# Patient Record
Sex: Female | Born: 1950 | Race: White | Hispanic: No | Smoking: Current every day smoker
Health system: Southern US, Community
[De-identification: ages and names within clinical notes are randomized; demographics above are authoritative.]

## PROBLEM LIST (undated history)

## (undated) DIAGNOSIS — M2141 Flat foot [pes planus] (acquired), right foot: Secondary | ICD-10-CM

## (undated) DIAGNOSIS — Z780 Asymptomatic menopausal state: Secondary | ICD-10-CM

## (undated) DIAGNOSIS — G4700 Insomnia, unspecified: Secondary | ICD-10-CM

## (undated) DIAGNOSIS — R413 Other amnesia: Secondary | ICD-10-CM

## (undated) DIAGNOSIS — F32A Depression, unspecified: Secondary | ICD-10-CM

## (undated) DIAGNOSIS — A6000 Herpesviral infection of urogenital system, unspecified: Secondary | ICD-10-CM

## (undated) DIAGNOSIS — K219 Gastro-esophageal reflux disease without esophagitis: Secondary | ICD-10-CM

## (undated) DIAGNOSIS — T7840XA Allergy, unspecified, initial encounter: Secondary | ICD-10-CM

## (undated) DIAGNOSIS — R946 Abnormal results of thyroid function studies: Secondary | ICD-10-CM

## (undated) DIAGNOSIS — M722 Plantar fascial fibromatosis: Secondary | ICD-10-CM

## (undated) DIAGNOSIS — G43909 Migraine, unspecified, not intractable, without status migrainosus: Secondary | ICD-10-CM

## (undated) DIAGNOSIS — E039 Hypothyroidism, unspecified: Secondary | ICD-10-CM

## (undated) DIAGNOSIS — G4733 Obstructive sleep apnea (adult) (pediatric): Secondary | ICD-10-CM

## (undated) DIAGNOSIS — K5790 Diverticulosis of intestine, part unspecified, without perforation or abscess without bleeding: Secondary | ICD-10-CM

## (undated) DIAGNOSIS — F988 Other specified behavioral and emotional disorders with onset usually occurring in childhood and adolescence: Secondary | ICD-10-CM

## (undated) DIAGNOSIS — M8580 Other specified disorders of bone density and structure, unspecified site: Secondary | ICD-10-CM

## (undated) DIAGNOSIS — E559 Vitamin D deficiency, unspecified: Secondary | ICD-10-CM

## (undated) DIAGNOSIS — M2142 Flat foot [pes planus] (acquired), left foot: Secondary | ICD-10-CM

## (undated) DIAGNOSIS — Z8742 Personal history of other diseases of the female genital tract: Secondary | ICD-10-CM

## (undated) DIAGNOSIS — F419 Anxiety disorder, unspecified: Secondary | ICD-10-CM

## (undated) DIAGNOSIS — R931 Abnormal findings on diagnostic imaging of heart and coronary circulation: Secondary | ICD-10-CM

## (undated) DIAGNOSIS — N859 Noninflammatory disorder of uterus, unspecified: Secondary | ICD-10-CM

## (undated) DIAGNOSIS — L52 Erythema nodosum: Secondary | ICD-10-CM

## (undated) DIAGNOSIS — G479 Sleep disorder, unspecified: Secondary | ICD-10-CM

## (undated) DIAGNOSIS — Z803 Family history of malignant neoplasm of breast: Secondary | ICD-10-CM

## (undated) DIAGNOSIS — Z7289 Other problems related to lifestyle: Secondary | ICD-10-CM

## (undated) HISTORY — DX: Hypothyroidism, unspecified: E03.9

## (undated) HISTORY — DX: Herpesviral infection of urogenital system, unspecified: A60.00

## (undated) HISTORY — PX: TONSILLECTOMY: SHX5217

## (undated) HISTORY — DX: Flat foot (pes planus) (acquired), right foot: M21.41

## (undated) HISTORY — PX: ENDOMETRIAL ABLATION: SHX621

## (undated) HISTORY — DX: Abnormal results of thyroid function studies: R94.6

## (undated) HISTORY — DX: Anxiety disorder, unspecified: F41.9

## (undated) HISTORY — DX: Erythema nodosum: L52

## (undated) HISTORY — DX: Family history of malignant neoplasm of breast: Z80.3

## (undated) HISTORY — DX: Noninflammatory disorder of uterus, unspecified: N85.9

## (undated) HISTORY — DX: Obstructive sleep apnea (adult) (pediatric): G47.33

## (undated) HISTORY — DX: Allergy, unspecified, initial encounter: T78.40XA

## (undated) HISTORY — DX: Other specified disorders of bone density and structure, unspecified site: M85.80

## (undated) HISTORY — PX: DILATION AND CURETTAGE OF UTERUS: SHX78

## (undated) HISTORY — DX: Other problems related to lifestyle: Z72.89

## (undated) HISTORY — DX: Insomnia, unspecified: G47.00

## (undated) HISTORY — DX: Diverticulosis of intestine, part unspecified, without perforation or abscess without bleeding: K57.90

## (undated) HISTORY — DX: Gastro-esophageal reflux disease without esophagitis: K21.9

## (undated) HISTORY — DX: Sleep disorder, unspecified: G47.9

## (undated) HISTORY — DX: Depression, unspecified: F32.A

## (undated) HISTORY — DX: Personal history of other diseases of the female genital tract: Z87.42

## (undated) HISTORY — DX: Flat foot (pes planus) (acquired), right foot: M21.42

## (undated) HISTORY — DX: Abnormal findings on diagnostic imaging of heart and coronary circulation: R93.1

## (undated) HISTORY — DX: Vitamin D deficiency, unspecified: E55.9

## (undated) HISTORY — DX: Asymptomatic menopausal state: Z78.0

## (undated) HISTORY — DX: Other amnesia: R41.3

## (undated) HISTORY — DX: Plantar fascial fibromatosis: M72.2

## (undated) HISTORY — DX: Migraine, unspecified, not intractable, without status migrainosus: G43.909

## (undated) HISTORY — PX: OTHER SURGICAL HISTORY: SHX169

## (undated) HISTORY — DX: Other specified behavioral and emotional disorders with onset usually occurring in childhood and adolescence: F98.8

---

## 2010-06-27 ENCOUNTER — Encounter: Payer: Self-pay | Admitting: Internal Medicine

## 2010-10-12 ENCOUNTER — Telehealth: Payer: Self-pay | Admitting: Internal Medicine

## 2010-11-01 ENCOUNTER — Ambulatory Visit: Admit: 2010-11-01 | Payer: Self-pay | Admitting: Internal Medicine

## 2010-11-29 NOTE — Progress Notes (Signed)
Summary: REQUEST TO EST AS NEW PT W/ DR SWORDS  Phone Note Call from Patient   Caller: Patient   [phone] Summary of Call: ---- 10/07/2010 1:48 PM, Birdie Sons MD wrote: ok  ---- 10/04/2010 4:47 PM, Debbra Riding wrote: Dr Cato Mulligan,  Caller:  Jerene Pitch,  DOB: [birth year]  Pt currently doesn't have insurance... Will be self pay.  Daughter and Son-n-law Irving Burton and Ralene Muskrat) - with whom she is currently residing with -  are current pts of Dr Cato Mulligan.  Caller just moved to [facility] from [place], will need cpx...Marland KitchenMarland KitchenWould like to become established with Dr Cato Mulligan.... Will you accept as a new patient?  Call back #  [phone]  or  [phone]   Initial call taken by: Debbra Riding,  October 12, 2010 12:05 PM     Appended Document: REQUEST TO EST AS NEW PT W/ DR SWORDS Pt was contacted and appt was scheduled for new pt est with Dr Cato Mulligan on  Jan. 5, 2012  at  8am.... Pt will be self pay - understands cost.

## 2010-11-29 NOTE — Letter (Signed)
Summary: Records from [facility] 2010 - 2011  Records from [facility] 2010 - 2011   Imported By: Maryln Gottron 11/05/2010 10:06:43  _____________________________________________________________________  External Attachment:    Type:   Image     Comment:   External Document

## 2012-03-06 ENCOUNTER — Other Ambulatory Visit (HOSPITAL_COMMUNITY): Payer: Self-pay | Admitting: Family Medicine

## 2012-03-06 DIAGNOSIS — Z1231 Encounter for screening mammogram for malignant neoplasm of breast: Secondary | ICD-10-CM

## 2012-03-24 ENCOUNTER — Other Ambulatory Visit (HOSPITAL_COMMUNITY)
Admission: RE | Admit: 2012-03-24 | Discharge: 2012-03-24 | Disposition: A | Discharge status: Home / Self Care | Payer: 59 | Source: Ambulatory Visit | Attending: Family Medicine | Admitting: Family Medicine

## 2012-03-24 DIAGNOSIS — Z124 Encounter for screening for malignant neoplasm of cervix: Secondary | ICD-10-CM | POA: Insufficient documentation

## 2012-03-24 DIAGNOSIS — Z1159 Encounter for screening for other viral diseases: Secondary | ICD-10-CM | POA: Insufficient documentation

## 2012-03-31 ENCOUNTER — Ambulatory Visit (HOSPITAL_COMMUNITY)
Admission: RE | Admit: 2012-03-31 | Discharge: 2012-03-31 | Disposition: A | Discharge status: Home / Self Care | Payer: 59 | Source: Ambulatory Visit | Attending: Family Medicine | Admitting: Family Medicine

## 2012-03-31 DIAGNOSIS — Z1231 Encounter for screening mammogram for malignant neoplasm of breast: Secondary | ICD-10-CM | POA: Insufficient documentation

## 2012-11-05 ENCOUNTER — Other Ambulatory Visit: Payer: Self-pay | Admitting: Otolaryngology

## 2012-11-05 DIAGNOSIS — R0982 Postnasal drip: Secondary | ICD-10-CM

## 2012-11-09 ENCOUNTER — Other Ambulatory Visit: Payer: 59

## 2012-11-12 ENCOUNTER — Ambulatory Visit
Admission: RE | Admit: 2012-11-12 | Discharge: 2012-11-12 | Disposition: A | Discharge status: Home / Self Care | Payer: 59 | Source: Ambulatory Visit | Attending: Otolaryngology | Admitting: Otolaryngology

## 2012-11-12 DIAGNOSIS — R0982 Postnasal drip: Secondary | ICD-10-CM

## 2018-08-04 DIAGNOSIS — B001 Herpesviral vesicular dermatitis: Secondary | ICD-10-CM | POA: Diagnosis not present

## 2018-08-11 DIAGNOSIS — B029 Zoster without complications: Secondary | ICD-10-CM | POA: Diagnosis not present

## 2018-10-09 DIAGNOSIS — B029 Zoster without complications: Secondary | ICD-10-CM | POA: Diagnosis not present

## 2019-03-24 ENCOUNTER — Other Ambulatory Visit: Payer: Self-pay | Admitting: Family Medicine

## 2019-03-24 ENCOUNTER — Other Ambulatory Visit (HOSPITAL_COMMUNITY)
Admission: RE | Admit: 2019-03-24 | Discharge: 2019-03-24 | Disposition: A | Discharge status: Home / Self Care | Payer: Medicare Other | Source: Ambulatory Visit | Attending: Family Medicine | Admitting: Family Medicine

## 2019-03-24 DIAGNOSIS — Z124 Encounter for screening for malignant neoplasm of cervix: Secondary | ICD-10-CM | POA: Diagnosis present

## 2019-03-24 DIAGNOSIS — Z1151 Encounter for screening for human papillomavirus (HPV): Secondary | ICD-10-CM | POA: Insufficient documentation

## 2019-03-26 ENCOUNTER — Other Ambulatory Visit: Payer: Self-pay | Admitting: Family Medicine

## 2019-03-26 DIAGNOSIS — E2839 Other primary ovarian failure: Secondary | ICD-10-CM

## 2019-03-26 DIAGNOSIS — Z1231 Encounter for screening mammogram for malignant neoplasm of breast: Secondary | ICD-10-CM

## 2019-03-26 LAB — CYTOLOGY - PAP
Diagnosis: NEGATIVE
HPV: NOT DETECTED

## 2019-05-13 ENCOUNTER — Encounter: Payer: Self-pay | Admitting: Radiology

## 2019-05-13 ENCOUNTER — Ambulatory Visit
Admission: RE | Admit: 2019-05-13 | Discharge: 2019-05-13 | Disposition: A | Discharge status: Home / Self Care | Payer: Medicare Other | Source: Ambulatory Visit | Attending: Family Medicine | Admitting: Family Medicine

## 2019-05-13 DIAGNOSIS — Z1231 Encounter for screening mammogram for malignant neoplasm of breast: Secondary | ICD-10-CM

## 2019-05-17 ENCOUNTER — Other Ambulatory Visit: Payer: Self-pay | Admitting: Family Medicine

## 2019-05-17 DIAGNOSIS — R928 Other abnormal and inconclusive findings on diagnostic imaging of breast: Secondary | ICD-10-CM

## 2019-05-26 ENCOUNTER — Ambulatory Visit
Admission: RE | Admit: 2019-05-26 | Discharge: 2019-05-26 | Disposition: A | Discharge status: Home / Self Care | Payer: Medicare Other | Source: Ambulatory Visit | Attending: Family Medicine | Admitting: Family Medicine

## 2019-05-26 ENCOUNTER — Other Ambulatory Visit: Payer: Self-pay

## 2019-05-26 DIAGNOSIS — R928 Other abnormal and inconclusive findings on diagnostic imaging of breast: Secondary | ICD-10-CM

## 2019-05-27 ENCOUNTER — Other Ambulatory Visit: Payer: Self-pay | Admitting: Family Medicine

## 2019-05-27 DIAGNOSIS — Z1231 Encounter for screening mammogram for malignant neoplasm of breast: Secondary | ICD-10-CM

## 2019-06-11 ENCOUNTER — Other Ambulatory Visit: Payer: Self-pay

## 2019-06-11 ENCOUNTER — Other Ambulatory Visit: Payer: Self-pay | Admitting: Family Medicine

## 2019-06-11 ENCOUNTER — Ambulatory Visit
Admission: RE | Admit: 2019-06-11 | Discharge: 2019-06-11 | Disposition: A | Discharge status: Home / Self Care | Payer: Medicare Other | Source: Ambulatory Visit | Attending: Family Medicine | Admitting: Family Medicine

## 2019-06-11 DIAGNOSIS — E2839 Other primary ovarian failure: Secondary | ICD-10-CM

## 2019-12-22 ENCOUNTER — Ambulatory Visit: Payer: Medicare Other | Attending: Internal Medicine

## 2019-12-22 DIAGNOSIS — Z20822 Contact with and (suspected) exposure to covid-19: Secondary | ICD-10-CM

## 2019-12-23 LAB — NOVEL CORONAVIRUS, NAA: SARS-CoV-2, NAA: NOT DETECTED

## 2020-05-09 ENCOUNTER — Other Ambulatory Visit: Payer: Self-pay | Admitting: Family Medicine

## 2020-05-09 DIAGNOSIS — Z1231 Encounter for screening mammogram for malignant neoplasm of breast: Secondary | ICD-10-CM

## 2020-05-22 ENCOUNTER — Ambulatory Visit
Admission: RE | Admit: 2020-05-22 | Discharge: 2020-05-22 | Disposition: A | Discharge status: Home / Self Care | Payer: Medicare Other | Source: Ambulatory Visit | Attending: Family Medicine | Admitting: Family Medicine

## 2020-05-22 ENCOUNTER — Other Ambulatory Visit: Payer: Self-pay

## 2020-05-22 DIAGNOSIS — Z1231 Encounter for screening mammogram for malignant neoplasm of breast: Secondary | ICD-10-CM

## 2020-11-11 DIAGNOSIS — Z1152 Encounter for screening for COVID-19: Secondary | ICD-10-CM | POA: Diagnosis not present

## 2020-11-17 DIAGNOSIS — J0100 Acute maxillary sinusitis, unspecified: Secondary | ICD-10-CM | POA: Diagnosis not present

## 2021-02-23 DIAGNOSIS — Z20822 Contact with and (suspected) exposure to covid-19: Secondary | ICD-10-CM | POA: Diagnosis not present

## 2021-02-23 DIAGNOSIS — J019 Acute sinusitis, unspecified: Secondary | ICD-10-CM | POA: Diagnosis not present

## 2021-04-12 DIAGNOSIS — R059 Cough, unspecified: Secondary | ICD-10-CM | POA: Diagnosis not present

## 2021-04-12 DIAGNOSIS — U071 COVID-19: Secondary | ICD-10-CM | POA: Diagnosis not present

## 2021-04-12 DIAGNOSIS — R509 Fever, unspecified: Secondary | ICD-10-CM | POA: Diagnosis not present

## 2021-04-12 DIAGNOSIS — J069 Acute upper respiratory infection, unspecified: Secondary | ICD-10-CM | POA: Diagnosis not present

## 2021-04-29 DIAGNOSIS — R52 Pain, unspecified: Secondary | ICD-10-CM | POA: Diagnosis not present

## 2021-04-29 DIAGNOSIS — R0981 Nasal congestion: Secondary | ICD-10-CM | POA: Diagnosis not present

## 2021-04-29 DIAGNOSIS — R059 Cough, unspecified: Secondary | ICD-10-CM | POA: Diagnosis not present

## 2021-04-30 DIAGNOSIS — U071 COVID-19: Secondary | ICD-10-CM | POA: Diagnosis not present

## 2021-04-30 DIAGNOSIS — R52 Pain, unspecified: Secondary | ICD-10-CM | POA: Diagnosis not present

## 2021-04-30 DIAGNOSIS — R059 Cough, unspecified: Secondary | ICD-10-CM | POA: Diagnosis not present

## 2021-05-24 DIAGNOSIS — E559 Vitamin D deficiency, unspecified: Secondary | ICD-10-CM | POA: Diagnosis not present

## 2021-05-24 DIAGNOSIS — E039 Hypothyroidism, unspecified: Secondary | ICD-10-CM | POA: Diagnosis not present

## 2021-05-24 DIAGNOSIS — E7800 Pure hypercholesterolemia, unspecified: Secondary | ICD-10-CM | POA: Diagnosis not present

## 2021-05-28 DIAGNOSIS — E7800 Pure hypercholesterolemia, unspecified: Secondary | ICD-10-CM | POA: Diagnosis not present

## 2021-05-28 DIAGNOSIS — L299 Pruritus, unspecified: Secondary | ICD-10-CM | POA: Diagnosis not present

## 2021-05-28 DIAGNOSIS — Z23 Encounter for immunization: Secondary | ICD-10-CM | POA: Diagnosis not present

## 2021-05-28 DIAGNOSIS — Z0000 Encounter for general adult medical examination without abnormal findings: Secondary | ICD-10-CM | POA: Diagnosis not present

## 2021-05-28 DIAGNOSIS — E039 Hypothyroidism, unspecified: Secondary | ICD-10-CM | POA: Diagnosis not present

## 2021-05-28 DIAGNOSIS — E559 Vitamin D deficiency, unspecified: Secondary | ICD-10-CM | POA: Diagnosis not present

## 2021-06-11 ENCOUNTER — Other Ambulatory Visit: Payer: Self-pay | Admitting: Family Medicine

## 2021-06-11 DIAGNOSIS — Z1231 Encounter for screening mammogram for malignant neoplasm of breast: Secondary | ICD-10-CM

## 2021-06-12 DIAGNOSIS — H33302 Unspecified retinal break, left eye: Secondary | ICD-10-CM | POA: Diagnosis not present

## 2021-06-12 DIAGNOSIS — H524 Presbyopia: Secondary | ICD-10-CM | POA: Diagnosis not present

## 2021-07-03 ENCOUNTER — Other Ambulatory Visit: Payer: Self-pay

## 2021-07-03 ENCOUNTER — Ambulatory Visit
Admission: RE | Admit: 2021-07-03 | Discharge: 2021-07-03 | Disposition: A | Discharge status: Home / Self Care | Payer: Medicare Other | Source: Ambulatory Visit | Attending: Family Medicine | Admitting: Family Medicine

## 2021-07-03 DIAGNOSIS — Z1231 Encounter for screening mammogram for malignant neoplasm of breast: Secondary | ICD-10-CM | POA: Diagnosis not present

## 2021-09-10 ENCOUNTER — Ambulatory Visit: Payer: Medicare Other | Admitting: Neurology

## 2021-09-10 ENCOUNTER — Encounter: Payer: Self-pay | Admitting: Neurology

## 2021-09-10 ENCOUNTER — Other Ambulatory Visit: Payer: Self-pay

## 2021-09-10 VITALS — BP 126/82 | HR 87 | Ht 60.0 in | Wt 169.0 lb

## 2021-09-10 DIAGNOSIS — F32A Depression, unspecified: Secondary | ICD-10-CM | POA: Diagnosis not present

## 2021-09-10 DIAGNOSIS — G3184 Mild cognitive impairment, so stated: Secondary | ICD-10-CM

## 2021-09-10 NOTE — Progress Notes (Signed)
[facility]  PATIENT: Charlene Hunt DOB: 02-May-1951  REQUESTING CLINICIAN: Shon Hale, * HISTORY FROM: Patient  REASON FOR VISIT: Memory decline    HISTORICAL  CHIEF COMPLAINT:  Chief Complaint  Patient presents with   New Patient (Initial Visit)    Rm 12, alone, acute memory impairment    HISTORY OF PRESENT ILLNESS:  This is a 70 year old woman with past medical history of headaches, hyperlipidemia, seasonal allergy, long history of depression and ADHD who is presenting for memory problem.  Patient stated she first noticed the symptoms in the summer, at that time she saw Flamingo but could not name it.  She called at the [employer].  She know it was a Flamingo but just cannot find the word.  Patient stated she had a couple time when she know what she wants to say but cannot find the word for it.  At one point, she was driving and had episode where she "blanked out', did not know where she was going.  Said that she was able to think hard and remember her destination.  Patient said that she is forgetful, for instance has to keep the keys in the same place or else she wont be able to find them.  She does listen to audiobook but cannot tell you anything about the book, it is the same with the movies.  Reported she lives alone but her family members have not complained about her memory.  She is able to function independently, does not need any help. She does not have any issues with conversation, she still drives, denies any accident, denies being lost while driving.  She does not have issue with her family members name.  Reported she has long history of depression and she self discontinued all her medications over a year ago because she felt it didn't help, currently describes her mood as okay and does not think that she is depressed.   OTHER MEDICAL CONDITIONS: HLD, Headaches, Sinus problems, Allergies, Depression, ADHD    REVIEW OF SYSTEMS: Full 14  system review of systems performed and negative with exception of: as noted in the HPI  ALLERGIES: Allergies  Allergen Reactions   Fish Allergy    Penicillins Hives    HOME MEDICATIONS: Outpatient Medications Prior to Visit  Medication Sig Dispense Refill   fluticasone (FLONASE) 50 MCG/ACT nasal spray Place into the nose.     loratadine (CLARITIN) 10 MG tablet Take by mouth.     omeprazole (PRILOSEC) 10 MG capsule Take by mouth.     No facility-administered medications prior to visit.    PAST MEDICAL HISTORY: History reviewed. No pertinent past medical history.  PAST SURGICAL HISTORY: History reviewed. No pertinent surgical history.  FAMILY HISTORY: Family History  Problem Relation Age of Onset   Breast cancer Sister        half sister    SOCIAL HISTORY: Social History   Socioeconomic History   Marital status: Divorced    Spouse name: Not on file   Number of children: Not on file   Years of education: Not on file   Highest education level: Not on file  Occupational History   Not on file  Tobacco Use   Smoking status: Not on file   Smokeless tobacco: Not on file  Substance and Sexual Activity   Alcohol use: Not on file   Drug use: Not on file   Sexual activity: Not on file  Other Topics Concern   Not on file  Social History Narrative   Not on file   Social Determinants of Health   Financial Resource Strain: Not on file  Food Insecurity: Not on file  Transportation Needs: Not on file  Physical Activity: Not on file  Stress: Not on file  Social Connections: Not on file  Intimate Partner Violence: Not on file    PHYSICAL EXAM  GENERAL EXAM/CONSTITUTIONAL: Vitals:  Vitals:   09/10/21 1030  BP: 126/82  Pulse: 87  Weight: 169 lb (76.7 kg)  Height: 5' (1.524 m)   Body mass index is 33.01 kg/m. Wt Readings from Last 3 Encounters:  09/10/21 169 lb (76.7 kg)   Patient is in no distress; well developed, nourished and groomed; neck is  supple  CARDIOVASCULAR: Examination of carotid arteries is normal; no carotid bruits Regular rate and rhythm, no murmurs Examination of peripheral vascular system by observation and palpation is normal  EYES: Pupils round and reactive to light, Visual fields full to confrontation, Extraocular movements intacts,   MUSCULOSKELETAL: Gait, strength, tone, movements noted in Neurologic exam below  NEUROLOGIC: MENTAL STATUS:  MMSE - Mini Mental State Exam 09/10/2021  Orientation to time 5  Orientation to Place 5  Registration 3  Attention/ Calculation 1  Recall 2  Language- name 2 objects 2  Language- repeat 1  Language- follow 3 step command 3  Language- read & follow direction 1  Write a sentence 1  Copy design 1  Total score 25   awake, alert, oriented to person, place and time recent and remote memory intact language fluent, comprehension intact, naming intact fund of knowledge appropriate  CRANIAL NERVE:  2nd, 3rd, 4th, 6th - pupils equal and reactive to light, visual fields full to confrontation, extraocular muscles intact, no nystagmus 5th - facial sensation symmetric 7th - facial strength symmetric 8th - hearing intact 9th - palate elevates symmetrically, uvula midline 11th - shoulder shrug symmetric 12th - tongue protrusion midline  MOTOR:  normal bulk and tone, full strength in the BUE, BLE  SENSORY:  normal and symmetric to light touch, pinprick, temperature, vibration  COORDINATION:  finger-nose-finger, fine finger movements normal  REFLEXES:  deep tendon reflexes present and symmetric  GAIT/STATION:  normal   DIAGNOSTIC DATA (LABS, IMAGING, TESTING) - I reviewed patient records, labs, notes, testing and imaging myself where available.  No results found for: WBC, HGB, HCT, MCV, PLT No results found for: NA, K, CL, CO2, GLUCOSE, BUN, CREATININE, CALCIUM, PROT, ALBUMIN, AST, ALT, ALKPHOS, BILITOT, GFRNONAA, GFRAA No results found for: CHOL, HDL,  LDLCALC, LDLDIRECT, TRIG, CHOLHDL No results found for: IYME1R No results found for: VITAMINB12 No results found for: TSH    ASSESSMENT AND PLAN  70 y.o. year old female with past medical history of depression, hyperlipidemia, seasonal allergies who is presenting with 108-months history of word finding difficulty and memory problem described as being forgetful, she lives alone able to function independently, able to complete all activities of daily living, she currently drives denies any accident, denies being lost in familiar places.  She handles her own bills, has been late due to procrastination per patient but not to the fact that she forgets to pay her bills.  She scored a 25 out of 30 on a Mini-Mental status exam, mainly lost points on attention and concentration.  I told patient that her complaint and findings on exam is concerning for depression rather than dementia.  I have recommended her to follow-up with her primary care doctor and to follow-up with psychiatry.  She does also have a history of sleep apnea but currently she is not on a CPAP.  I also advised her to follow-up with a sleep doctor to get evaluated for the CPAP machine.  Return if worse.    1. Depression, unspecified depression type   2. Mild cognitive impairment     PLAN: Continue current medications  Follow with a psychiatrist  Follow up with sleep medicine  Follow up with your primary care physician  Return if worse     No orders of the defined types were placed in this encounter.   No orders of the defined types were placed in this encounter.   Return if symptoms worsen or fail to improve.    Windell Norfolk, MD 09/10/2021, 3:06 PM  [facility] [address] [place], [place] [address] [phone]

## 2021-09-10 NOTE — Patient Instructions (Addendum)
Continue current medications  Follow with a psychiatrist  Follow up with sleep medicine  Follow up with your primary care physician  Return if worse     There are well-accepted and sensible ways to reduce risk for Alzheimers disease and other degenerative brain disorders .  Exercise Daily Walk A daily 20 minute walk should be part of your routine. Disease related apathy can be a significant roadblock to exercise and the only way to overcome this is to make it a daily routine and perhaps have a reward at the end (something your loved one loves to eat or drink perhaps) or a personal trainer coming to the home can also be very useful. Most importantly, the patient is much more likely to exercise if the caregiver / spouse does it with him/her. In general a structured, repetitive schedule is best.  General Health: Any diseases which effect your body will effect your brain such as a pneumonia, urinary infection, blood clot, heart attack or stroke. Keep contact with your primary care doctor for regular follow ups.  Sleep. A good nights sleep is healthy for the brain. Seven hours is recommended. If you have insomnia or poor sleep habits we can give you some instructions. If you have sleep apnea wear your mask.  Diet: Eating a heart healthy diet is also a good idea; fish and poultry instead of red meat, nuts (mostly non-peanuts), vegetables, fruits, olive oil or canola oil (instead of butter), minimal salt (use other spices to flavor foods), whole grain rice, bread, cereal and pasta and wine in moderation.Research is now showing that the MIND diet, which is a combination of The Mediterranean diet and the DASH diet, is beneficial for cognitive processing and longevity. Information about this diet can be found in The MIND Diet, a book by Alonna Minium, MS, RDN, and online at [employer]  Finances, Power of [address] and Advance Directives: You should consider putting legal  safeguards in place with regard to financial and medical decision making. While the spouse always has power of attorney for medical and financial issues in the absence of any form, you should consider what you want in case the spouse / caregiver is no longer around or capable of making decisions.      Heart-head connection  New research shows there are things we can do to reduce the risk of mild cognitive impairment and dementia.  Several conditions known to increase the risk of cardiovascular disease -- such as high blood pressure, diabetes and high cholesterol -- also increase the risk of developing Alzheimer's. Some autopsy studies show that as many as 80 percent of individuals with Alzheimer's disease also have cardiovascular disease.  A longstanding question is why some people develop hallmark Alzheimer's plaques and tangles but do not develop the symptoms of Alzheimer's. Vascular disease may help researchers eventually find an answer. Some autopsy studies suggest that plaques and tangles may be present in the brain without causing symptoms of cognitive decline unless the brain also shows evidence of vascular disease. More research is needed to better understand the link between vascular health and Alzheimer's.  Physical exercise and diet Regular physical exercise may be a beneficial strategy to lower the risk of Alzheimer's and vascular dementia. Exercise may directly benefit brain cells by increasing blood and oxygen flow in the brain. Because of its known cardiovascular benefits, a medically approved exercise program is a valuable part of any overall wellness plan.  Current evidence suggests that heart-healthy eating may also help protect the  brain. Heart-healthy eating includes limiting the intake of sugar and saturated fats and making sure to eat plenty of fruits, vegetables, and whole grains. No one diet is best. Two diets that have been studied and may be beneficial are the DASH (Dietary  Approaches to Stop Hypertension) diet and the Mediterranean diet. The DASH diet emphasizes vegetables, fruits and fat-free or low-fat dairy products; includes whole grains, fish, poultry, beans, seeds, nuts and vegetable oils; and limits sodium, sweets, sugary beverages and red meats. A Mediterranean diet includes relatively little red meat and emphasizes whole grains, fruits and vegetables, fish and shellfish, and nuts, olive oil and other healthy fats.  Social connections and intellectual activity A number of studies indicate that maintaining strong social connections and keeping mentally active as we age might lower the risk of cognitive decline and Alzheimer's. Experts are not certain about the reason for this association. It may be due to direct mechanisms through which social and mental stimulation strengthen connections between nerve cells in the brain.  Head trauma There appears to be a strong link between future risk of Alzheimer's and serious head trauma, especially when injury involves loss of consciousness. You can help reduce your risk of Alzheimer's by protecting your head.  Wear a seat belt  Use a helmet when participating in sports  "Fall-proof" your home   What you can do now While research is not yet conclusive, certain lifestyle choices, such as physical activity and diet, may help support brain health and prevent Alzheimer's. Many of these lifestyle changes have been shown to lower the risk of other diseases, like heart disease and diabetes, which have been linked to Alzheimer's. With few drawbacks and plenty of known benefits, healthy lifestyle choices can improve your health and possibly protect your brain.  Learn more about brain health. You can help increase our knowledge by considering participation in a clinical study. Our free clinical trial matching services, TrialMatch, can help you find clinical trials in your area that are seeking volunteers.

## 2022-06-17 DIAGNOSIS — Z1211 Encounter for screening for malignant neoplasm of colon: Secondary | ICD-10-CM | POA: Diagnosis not present

## 2022-06-17 DIAGNOSIS — E559 Vitamin D deficiency, unspecified: Secondary | ICD-10-CM | POA: Diagnosis not present

## 2022-06-17 DIAGNOSIS — Z79899 Other long term (current) drug therapy: Secondary | ICD-10-CM | POA: Diagnosis not present

## 2022-06-17 DIAGNOSIS — K219 Gastro-esophageal reflux disease without esophagitis: Secondary | ICD-10-CM | POA: Diagnosis not present

## 2022-06-17 DIAGNOSIS — Z720 Tobacco use: Secondary | ICD-10-CM | POA: Diagnosis not present

## 2022-06-17 DIAGNOSIS — Z0000 Encounter for general adult medical examination without abnormal findings: Secondary | ICD-10-CM | POA: Diagnosis not present

## 2022-06-17 DIAGNOSIS — R946 Abnormal results of thyroid function studies: Secondary | ICD-10-CM | POA: Diagnosis not present

## 2022-06-17 DIAGNOSIS — M25512 Pain in left shoulder: Secondary | ICD-10-CM | POA: Diagnosis not present

## 2022-06-17 DIAGNOSIS — E7800 Pure hypercholesterolemia, unspecified: Secondary | ICD-10-CM | POA: Diagnosis not present

## 2022-06-17 DIAGNOSIS — E039 Hypothyroidism, unspecified: Secondary | ICD-10-CM | POA: Diagnosis not present

## 2022-07-02 ENCOUNTER — Other Ambulatory Visit (HOSPITAL_COMMUNITY): Payer: Self-pay | Admitting: Family Medicine

## 2022-07-02 DIAGNOSIS — E785 Hyperlipidemia, unspecified: Secondary | ICD-10-CM

## 2022-07-05 DIAGNOSIS — M542 Cervicalgia: Secondary | ICD-10-CM | POA: Diagnosis not present

## 2022-07-09 DIAGNOSIS — H524 Presbyopia: Secondary | ICD-10-CM | POA: Diagnosis not present

## 2022-07-09 DIAGNOSIS — Z961 Presence of intraocular lens: Secondary | ICD-10-CM | POA: Diagnosis not present

## 2022-07-12 ENCOUNTER — Ambulatory Visit (HOSPITAL_COMMUNITY)
Admission: RE | Admit: 2022-07-12 | Discharge: 2022-07-12 | Disposition: A | Discharge status: Home / Self Care | Payer: Medicare Other | Source: Ambulatory Visit | Attending: Family Medicine | Admitting: Family Medicine

## 2022-07-12 DIAGNOSIS — E785 Hyperlipidemia, unspecified: Secondary | ICD-10-CM | POA: Insufficient documentation

## 2022-08-06 DIAGNOSIS — M542 Cervicalgia: Secondary | ICD-10-CM | POA: Diagnosis not present

## 2022-08-08 ENCOUNTER — Other Ambulatory Visit: Payer: Self-pay | Admitting: Family Medicine

## 2022-08-08 DIAGNOSIS — Z1231 Encounter for screening mammogram for malignant neoplasm of breast: Secondary | ICD-10-CM

## 2022-08-08 NOTE — Therapy (Deleted)
OUTPATIENT PHYSICAL THERAPY CERVICAL EVALUATION   Patient Name: Charlene Hunt MRN: [id number] DOB:[birth year], 71 y.o., female Today's Date: 08/08/2022    No past medical history on file. No past surgical history on file. There are no problems to display for this patient.   PCP: Shon Hale, MD  REFERRING PROVIDER: Christena Deem, MD  REFERRING DIAG: Cervicalgia [M54.2]  THERAPY DIAG:  No diagnosis found.  Rationale for Evaluation and Treatment Rehabilitation  ONSET DATE: ***  SUBJECTIVE:                                                                                                                                                                                                         SUBJECTIVE STATEMENT: ***  PERTINENT HISTORY:  None  PAIN:  Are you having pain? Yes: NPRS scale: ***/10 Pain location: *** Pain description: *** Aggravating factors: *** Relieving factors: ***  PRECAUTIONS: {Therapy precautions:24002}  WEIGHT BEARING RESTRICTIONS {Yes ***/No:24003}  FALLS:  Has patient fallen in last 6 months? {fallsyesno:27318}  LIVING ENVIRONMENT: Lives with: {OPRC lives with:25569::"lives with their family"} Lives in: {Lives in:25570} Stairs: {opstairs:27293} Has following equipment at home: {Assistive devices:23999}  OCCUPATION: ***  PLOF: {PLOF:24004}  PATIENT GOALS ***  OBJECTIVE:   DIAGNOSTIC FINDINGS:  None  PATIENT SURVEYS:  FOTO ***   COGNITION: Overall cognitive status: Within functional limits for tasks assessed   SENSATION: WFL  POSTURE: {posture:25561}  PALPATION: ***   CERVICAL ROM:   {AROM/PROM:27142} ROM A/PROM (deg) eval  Flexion   Extension   Right lateral flexion   Left lateral flexion   Right rotation   Left rotation    (Blank rows = not tested)  UPPER EXTREMITY ROM:  {AROM/PROM:27142} ROM Right eval Left eval  Shoulder flexion    Shoulder extension    Shoulder abduction     Shoulder adduction    Shoulder extension    Shoulder internal rotation    Shoulder external rotation    Elbow flexion    Elbow extension    Wrist flexion    Wrist extension    Wrist ulnar deviation    Wrist radial deviation    Wrist pronation    Wrist supination     (Blank rows = not tested)  UPPER EXTREMITY MMT:  MMT Right eval Left eval  Shoulder flexion    Shoulder extension    Shoulder abduction    Shoulder adduction    Shoulder extension    Shoulder internal rotation    Shoulder external rotation    Middle trapezius    Lower trapezius  Elbow flexion    Elbow extension    Wrist flexion    Wrist extension    Wrist ulnar deviation    Wrist radial deviation    Wrist pronation    Wrist supination    Grip strength     (Blank rows = not tested)  CERVICAL SPECIAL TESTS:  {Cervical special tests:25246}   FUNCTIONAL TESTS:  {Functional tests:24029}   TODAY'S TREATMENT:  Creating, reviewing, and completing below HEP   PATIENT EDUCATION:  Education details: Educated pt on anatomy and physiology of current symptoms, FOTO, diagnosis, prognosis, HEP,  and POC. Person educated: Patient Education method: Customer service manager Education comprehension: verbalized understanding and returned demonstration   HOME EXERCISE PROGRAM: ***  ASSESSMENT:  CLINICAL IMPRESSION: Patient referred to PT for  .Patient will benefit from skilled PT to address below impairments, limitations and improve overall function.  OBJECTIVE IMPAIRMENTS: decreased activity tolerance, decreased shoulder mobility, decreased ROM, decreased strength, impaired flexibility, impaired UE use, postural dysfunction, and pain.  ACTIVITY LIMITATIONS: reaching, lifting, carry,  cleaning, driving, and or occupation  PERSONAL FACTORS:  also affecting patient's functional outcome.  REHAB POTENTIAL: Good  CLINICAL DECISION MAKING: Stable/uncomplicated  EVALUATION COMPLEXITY:  Low    GOALS: Short term PT Goals Target date: 09/05/2022 Pt will be I and compliant with HEP. Baseline:  Goal status: New Pt will decrease pain by 25% overall Baseline: Goal status: New  Long term PT goals Target date: 10/03/2022 Pt will improve Rt shoulder AROM to [facility] to improve functional reaching Baseline: Goal status: New Pt will improve  Rt shoulder strength to at least 4+/5 MMT to improve functional strength Baseline: Goal status: New Pt will improve FOTO to at least % functional to show improved function Baseline: Goal status: New Pt will reduce pain to overall less than 3/10 with usual activity and work activity. Baseline: Goal status: New  PLAN: PT FREQUENCY: 1***  PT DURATION: ***  PLANNED INTERVENTIONS (unless contraindicated): aquatic PT, Canalith repositioning, cryotherapy, Electrical stimulation, Iontophoresis with 4 mg/ml dexamethasome, Moist heat, traction, Ultrasound, gait training, Therapeutic exercise, balance training, neuromuscular re-education, patient/family education, prosthetic training, manual techniques, passive ROM, dry needling, taping, vasopnuematic device, vestibular, spinal manipulations, joint manipulations  PLAN FOR NEXT SESSION: Assess HEP/update PRN, continue to progress **** mobility, strengthen **** muscles. Decrease patients pain and help minimize ****  Lynden Ang, PT 08/08/2022, 4:11 PM

## 2022-08-09 ENCOUNTER — Ambulatory Visit: Payer: Medicare Other | Attending: Sports Medicine | Admitting: Physical Therapy

## 2022-08-09 DIAGNOSIS — M6281 Muscle weakness (generalized): Secondary | ICD-10-CM | POA: Insufficient documentation

## 2022-08-09 DIAGNOSIS — M542 Cervicalgia: Secondary | ICD-10-CM | POA: Insufficient documentation

## 2022-08-09 DIAGNOSIS — R293 Abnormal posture: Secondary | ICD-10-CM | POA: Insufficient documentation

## 2022-08-09 DIAGNOSIS — R252 Cramp and spasm: Secondary | ICD-10-CM | POA: Insufficient documentation

## 2022-08-13 ENCOUNTER — Other Ambulatory Visit: Payer: Self-pay

## 2022-08-13 ENCOUNTER — Ambulatory Visit: Payer: Medicare Other

## 2022-08-13 DIAGNOSIS — R252 Cramp and spasm: Secondary | ICD-10-CM | POA: Diagnosis not present

## 2022-08-13 DIAGNOSIS — M6281 Muscle weakness (generalized): Secondary | ICD-10-CM | POA: Diagnosis not present

## 2022-08-13 DIAGNOSIS — M542 Cervicalgia: Secondary | ICD-10-CM | POA: Diagnosis not present

## 2022-08-13 DIAGNOSIS — R293 Abnormal posture: Secondary | ICD-10-CM | POA: Diagnosis not present

## 2022-08-13 NOTE — Therapy (Signed)
OUTPATIENT PHYSICAL THERAPY CERVICAL EVALUATION   Patient Name: Charlene Hunt MRN: [id number] DOB:03/18/1951, 71 y.o., female Today's Date: 08/13/2022   PT End of Session - 08/13/22 1116     Visit Number 1    Date for PT Re-Evaluation 10/08/22    Authorization Type [employer]    PT Start Time 1104    PT Stop Time 1145    PT Time Calculation (min) 41 min    Activity Tolerance Patient tolerated treatment well    Behavior During Therapy WFL for tasks assessed/performed             Past Medical History:  Diagnosis Date   Abnormal finding on thyroid function test    Acid reflux    ADD (attention deficit disorder)    Allergies    Anxiety    Asymptomatic menopausal state    Current every day vaping    Depression    Diverticulosis    Elevated coronary artery calcium score    Endometrial disorder    Erythema nodosum    Family history of breast cancer    Flat feet    GERD (gastroesophageal reflux disease)    H/O menorrhagia    Herpesviral infection of urogenital system    Hypothyroidism    Insomnia    Memory impairment    Migraine    OSA (obstructive sleep apnea)    Osteopenia    Plantar fasciitis    Sleep disorder    Vitamin D deficiency    Past Surgical History:  Procedure Laterality Date   C SECTIONS     CATARACTS     DILATION AND CURETTAGE OF UTERUS     ENDOMETRIAL ABLATION     NASAL SEPTOPLASTY FOR DEVIATED SEPTUM     TONSILLECTOMY     TORN RETINA     WISDOMTEETH     Patient Active Problem List   Diagnosis Date Noted   Cervicalgia 08/13/2022    PCP: Shon Hale, MD  REFERRING PROVIDER: Christena Deem, MD   REFERRING DIAG: M54.2 (ICD-10-CM) - Cervicalgia   THERAPY DIAG:  Cervicalgia  Cramp and spasm  Muscle weakness (generalized)  Abnormal posture  Rationale for Evaluation and Treatment Rehabilitation  ONSET DATE: 08/08/2022   SUBJECTIVE:                                                                                                                                                                                                          SUBJECTIVE STATEMENT: Patient reports history of left sided neck pain. She was a  special ed teacher and admits that her pain was a lot worse when she was working due to high stress.  She is retired now and states she is better but her neck, and shoulder area are still quite bothersome.  She has difficulty sleeping at night at times and must find specific positions to avoid the pain waking her up.  She was historically not very active but had started a walking program.  She was doing this 6 days per week but she got side tracked and has not done any walking in the past several weeks.  She recently went canoeing and had a significant set back.  She would like to resolve her neck symptoms and not have to deal with it daily.    PERTINENT HISTORY:  Sleep apnea, insomnia, excessive stress  PAIN:  Are you having pain? Yes: NPRS scale: 4/10 Pain location: left side of neck and upper trap Pain description: aching Aggravating factors: nothing in particular Relieving factors: Tylenol, heat  PRECAUTIONS: None  WEIGHT BEARING RESTRICTIONS No  FALLS:  Has patient fallen in last 6 months? Yes. Number of falls was carrying her grandson up some steps and he was writhing around.    OCCUPATION: retired Agricultural engineer  PLOF: Independent, Independent with basic ADLs, Independent with household mobility without device, Independent with community mobility without device, Independent with homemaking with ambulation, Independent with gait, and Independent with transfers  PATIENT GOALS   Less pain  OBJECTIVE:   DIAGNOSTIC FINDINGS:  na  PATIENT SURVEYS:  FOTO 56 goal is 70)   COGNITION: Overall cognitive status: Within functional limits for tasks assessed   SENSATION: WFL Patient reports history of some symptoms associated with carpal tunnel bilaterally and  left thumb trigger finger,  also hx of mild left shoulder injury in yoga.   POSTURE: rounded shoulders and forward head slight kyphosis  PALPATION: Tender, tight bands, trigger points bilateral upper traps, parascapular areas Left > right    CERVICAL ROM:   Active ROM A/PROM (deg) eval  Flexion WNL  Extension 50  Right lateral flexion 32  Left lateral flexion 30  Right rotation 42  Left rotation 45   (Blank rows = not tested)  UPPER EXTREMITY ROM:  WNL  UPPER EXTREMITY MMT:  Left shoulder ER and scaption with IR both 3+/5,  all others generally 4+/5  CERVICAL SPECIAL TESTS:  Spurling's test: Negative   FUNCTIONAL TESTS:  5 times sit to stand : 14.06 sec  TODAY'S TREATMENT:  Initial eval completed and initiated HEP   PATIENT EDUCATION:  Education details: Initiated HEP, educated on dry needling and provided handout Person educated: Patient Education method: Consulting civil engineer, Media planner, Verbal cues, and Handouts Education comprehension: verbalized understanding, returned demonstration, and verbal cues required Trigger Point Dry Needling  What is Trigger Point Dry Needling (DN)? DN is a physical therapy technique used to treat muscle pain and dysfunction. Specifically, DN helps deactivate muscle trigger points (muscle knots).  A thin filiform needle is used to penetrate the skin and stimulate the underlying trigger point. The goal is for a local twitch response (LTR) to occur and for the trigger point to relax. No medication of any kind is injected during the procedure.   What Does Trigger Point Dry Needling Feel Like?  The procedure feels different for each individual patient. Some patients report that they do not actually feel the needle enter the skin and overall the process is not painful. Very mild bleeding may occur. However, many patients feel a  deep cramping in the muscle in which the needle was inserted. This is the local twitch response.   How Will I feel  after the treatment? Soreness is normal, and the onset of soreness may not occur for a few hours. Typically this soreness does not last longer than two days.  Bruising is uncommon, however; ice can be used to decrease any possible bruising.  In rare cases feeling tired or nauseous after the treatment is normal. In addition, your symptoms may get worse before they get better, this period will typically not last longer than 24 hours.   What Can I do After My Treatment? Increase your hydration by drinking more water for the next 24 hours. You may place ice or heat on the areas treated that have become sore, however, do not use heat on inflamed or bruised areas. Heat often brings more relief post needling. You can continue your regular activities, but vigorous activity is not recommended initially after the treatment for 24 hours. DN is best combined with other physical therapy such as strengthening, stretching, and other therapies.  [facility]  [address] [place] [address].  [phone]    HOME EXERCISE PROGRAM: Access Code: [email or web address] URL: [email or web address] Date: 08/13/2022 Prepared by: Mikey Kirschner  Exercises - Seated Scapular Retraction  - 1 x daily - 7 x weekly - 3 sets - 10 reps - Shoulder Rolls in Sitting  - 1 x daily - 7 x weekly - 3 sets - 10 reps - Seated Cervical Retraction  - 1 x daily - 7 x weekly - 3 sets - 10 reps - Seated Cervical Rotation AROM  - 1 x daily - 7 x weekly - 3 sets - 10 reps - Seated Cervical Sidebending AROM  - 1 x daily - 7 x weekly - 3 sets - 10 reps - Shoulder extension with resistance - Neutral  - 1 x daily - 7 x weekly - 3 sets - 10 reps - Standing Shoulder Row with Anchored Resistance  - 1 x daily - 7 x weekly - 3 sets - 10 reps - Seated Shoulder External Rotation AAROM with Pulley  - 1 x daily - 7 x weekly - 3 sets - 10 reps - Seated Shoulder Horizontal Abduction with Resistance  - 1 x daily - 7  x weekly - 3 sets - 10 reps  ASSESSMENT:  CLINICAL IMPRESSION: Patient is a 71 y.o. female who was seen today for physical therapy evaluation and treatment for neck pain. She presents with decreased cervical ROM and left shoulder weakness, along with tight bands and trigger points bilateral upper traps and parascapular areas.  She would benefit from skilled PT for postural strengthening, left shoulder strengthening and stability training and c spine ROM.  She would also benefit from dry needling for C spine, upper traps and parascapular areas.     OBJECTIVE IMPAIRMENTS decreased cognition, decreased ROM, decreased strength, increased fascial restrictions, increased muscle spasms, impaired flexibility, postural dysfunction, and pain.   ACTIVITY LIMITATIONS carrying, lifting, sleeping, transfers, bed mobility, dressing, reach over head, and hygiene/grooming  PARTICIPATION LIMITATIONS: meal prep, cleaning, laundry, driving, shopping, community activity, occupation, and yard work  PERSONAL FACTORS Behavior pattern, Fitness, Past/current experiences, Time since onset of injury/illness/exacerbation, and 1-2 comorbidities: insomnia,  are also affecting patient's functional outcome.   REHAB POTENTIAL: Good  CLINICAL DECISION MAKING: Stable/uncomplicated  EVALUATION COMPLEXITY: Low   GOALS: Goals reviewed with patient? Yes  SHORT TERM GOALS: Target date: 09/10/2022  Patient will be independent with initial HEP  Baseline: na Goal status: INITIAL  2.  Pain report to be no greater than 4/10  Baseline: na Goal status: INITIAL  3.  Patient to be able to fall asleep no later than 10pm and sleep consecutive 4 hours Baseline: na Goal status: INITIAL   LONG TERM GOALS: Target date: 10/08/2022  Patient to be independent with advanced HEP  Baseline: na Goal status: INITIAL  2.  Patient to report pain no greater than 2/10  Baseline: na Goal status: INITIAL  3.  Patient to be able to  sleep through the night  Baseline: na Goal status: INITIAL  4.  Patient to begin and maintain a consistent exercise regimen Baseline: na Goal status: INITIAL  5.  Cervical ROM to be [facility] Baseline: na Goal status: INITIAL  6.  UE and postural strength to be 4+/5 Baseline: na Goal status: INITIAL   PLAN: PT FREQUENCY: 1-2x/week  PT DURATION: 8 weeks  PLANNED INTERVENTIONS: Therapeutic exercises, Therapeutic activity, Neuromuscular re-education, Balance training, Patient/Family education, Self Care, Joint mobilization, Aquatic Therapy, Dry Needling, Electrical stimulation, Spinal mobilization, Cryotherapy, Moist heat, scar mobilization, Taping, Traction, Ultrasound, Ionotophoresis 4mg /ml Dexamethasone, Manual therapy, and Re-evaluation  PLAN FOR NEXT SESSION: Review HEP, DN if patient agrees, progress postural strength   Ginna Schuur B. Susano Cleckler, PT 08/13/22 5:09 PM   [facility] [address] [place], [place] Phone # [phone] Fax [phone]

## 2022-08-15 ENCOUNTER — Encounter: Payer: Self-pay | Admitting: Rehabilitative and Restorative Service Providers"

## 2022-08-15 ENCOUNTER — Ambulatory Visit: Payer: Medicare Other | Admitting: Rehabilitative and Restorative Service Providers"

## 2022-08-15 DIAGNOSIS — R293 Abnormal posture: Secondary | ICD-10-CM

## 2022-08-15 DIAGNOSIS — R252 Cramp and spasm: Secondary | ICD-10-CM

## 2022-08-15 DIAGNOSIS — M6281 Muscle weakness (generalized): Secondary | ICD-10-CM

## 2022-08-15 DIAGNOSIS — M542 Cervicalgia: Secondary | ICD-10-CM

## 2022-08-15 NOTE — Therapy (Signed)
OUTPATIENT PHYSICAL THERAPY TREATMENT NOTE   Patient Name: Charlene Hunt MRN: [id number] DOB:07/21/1951, 71 y.o., female Today's Date: 08/15/2022   PT End of Session - 08/15/22 0935     Visit Number 2    Date for PT Re-Evaluation 10/08/22    Authorization Type [employer]    PT Start Time 0930    PT Stop Time 1010    PT Time Calculation (min) 40 min    Activity Tolerance Patient tolerated treatment well    Behavior During Therapy WFL for tasks assessed/performed             Past Medical History:  Diagnosis Date   Abnormal finding on thyroid function test    Acid reflux    ADD (attention deficit disorder)    Allergies    Anxiety    Asymptomatic menopausal state    Current every day vaping    Depression    Diverticulosis    Elevated coronary artery calcium score    Endometrial disorder    Erythema nodosum    Family history of breast cancer    Flat feet    GERD (gastroesophageal reflux disease)    H/O menorrhagia    Herpesviral infection of urogenital system    Hypothyroidism    Insomnia    Memory impairment    Migraine    OSA (obstructive sleep apnea)    Osteopenia    Plantar fasciitis    Sleep disorder    Vitamin D deficiency    Past Surgical History:  Procedure Laterality Date   C SECTIONS     CATARACTS     DILATION AND CURETTAGE OF UTERUS     ENDOMETRIAL ABLATION     NASAL SEPTOPLASTY FOR DEVIATED SEPTUM     TONSILLECTOMY     TORN RETINA     [place]     Patient Active Problem List   Diagnosis Date Noted   Cervicalgia 08/13/2022    PCP: Glenis Smoker, MD  REFERRING PROVIDER: Inez Catalina, MD   REFERRING DIAG: M54.2 (ICD-10-CM) - Cervicalgia   THERAPY DIAG:  Cervicalgia  Cramp and spasm  Muscle weakness (generalized)  Abnormal posture  Rationale for Evaluation and Treatment Rehabilitation  ONSET DATE: 08/08/2022   SUBJECTIVE:                                                                                                                                                                                                          SUBJECTIVE STATEMENT: Patient states that her homework is going okay, but she has  a question with the last one.  PERTINENT HISTORY:  Sleep apnea, insomnia, excessive stress  PAIN:  Are you having pain? Yes: NPRS scale: 6/10 Pain location: left side of neck and upper trap Pain description: aching Aggravating factors: nothing in particular Relieving factors: Tylenol, heat  PRECAUTIONS: None  WEIGHT BEARING RESTRICTIONS No  FALLS:  Has patient fallen in last 6 months? Yes. Number of falls was carrying her grandson up some steps and he was writhing around.    OCCUPATION: retired Systems developer  PLOF: Independent, Independent with basic ADLs, Independent with household mobility without device, Independent with community mobility without device, Independent with homemaking with ambulation, Independent with gait, and Independent with transfers  PATIENT GOALS   Less pain  OBJECTIVE:   DIAGNOSTIC FINDINGS:  na  PATIENT SURVEYS:  Eval:  FOTO 56 goal is 64)   COGNITION: Overall cognitive status: Within functional limits for tasks assessed   SENSATION: WFL Patient reports history of some symptoms associated with carpal tunnel bilaterally and left thumb trigger finger,  also hx of mild left shoulder injury in yoga.   POSTURE: rounded shoulders and forward head slight kyphosis  PALPATION: Tender, tight bands, trigger points bilateral upper traps, parascapular areas Left > right    CERVICAL ROM:   Active ROM A/PROM (deg) eval  Flexion WNL  Extension 50  Right lateral flexion 32  Left lateral flexion 30  Right rotation 42  Left rotation 45   (Blank rows = not tested)  UPPER EXTREMITY ROM:  WNL  UPPER EXTREMITY MMT:  Eval:  Left shoulder ER and scaption with IR both 3+/5,  all others generally 4+/5  CERVICAL SPECIAL TESTS:   Eval:  Spurling's test: Negative   FUNCTIONAL TESTS:  Eval:  5 times sit to stand : 14.06 sec  TODAY'S TREATMENT:  08/15/2022: UBE level 1.0 x3 min each direction with PT present to discuss status Seated cervical retraction and scapular retraction 2x10 each Cervical rotation and extension.  X15 each Seated upper trap and levator stretch 2x20 sec bilat Seated shoulder ER and horizontal abduction with red tband 2x10 Trigger Point Dry-Needling  Treatment instructions: Expect mild to moderate muscle soreness. S/S of pneumothorax if dry needled over a lung field, and to seek immediate medical attention should they occur. Patient verbalized understanding of these instructions and education. Patient Consent Given: Yes Education handout provided: Previously provided Muscles treated: Bilat suboccipitals, left cervical multifidi, left upper trap Electrical stimulation performed: No Parameters: N/A Treatment response/outcome: Utilized skilled palpation to identify trigger points.  Able to palpate muscle twitch and muscle elongation following dry needling.    PATIENT EDUCATION:  Education details: Initiated HEP, educated on dry needling and provided handout Person educated: Patient Education method: Programmer, multimedia, Facilities manager, Verbal cues, and Handouts Education comprehension: verbalized understanding, returned demonstration, and verbal cues required  HOME EXERCISE PROGRAM: Access Code: [email or web address] URL: [email or web address] Date: 08/15/2022 Prepared by: Clydie Braun Brainard Highfill  Exercises - Seated Upper Trapezius Stretch  - 1 x daily - 7 x weekly - 1 sets - 2 reps - 20 sec hold - Seated Levator Scapulae Stretch  - 1 x daily - 7 x weekly - 1 sets - 2 reps - 20 sec hold - Seated Scapular Retraction  - 1 x daily - 7 x weekly - 3 sets - 10 reps - Shoulder Rolls in Sitting  - 1 x daily - 7 x weekly - 3 sets - 10 reps - Seated Cervical Retraction  - 1 x daily - 7 x  weekly - 3 sets - 10  reps - Seated Cervical Rotation AROM  - 1 x daily - 7 x weekly - 3 sets - 10 reps - Shoulder extension with resistance - Neutral  - 1 x daily - 7 x weekly - 3 sets - 10 reps - Standing Shoulder Row with Anchored Resistance  - 1 x daily - 7 x weekly - 3 sets - 10 reps - Seated Shoulder Horizontal Abduction with Resistance  - 1 x daily - 7 x weekly - 3 sets - 10 reps - Shoulder External Rotation and Scapular Retraction with Resistance  - 1 x daily - 7 x weekly - 2 sets - 10 reps  ASSESSMENT:  CLINICAL IMPRESSION: Ms King presents to skilled PT with reports of overall doing well with HEP and agreeable to dry needling.  Pt able to progress through HEP exercises with updates to HEP made.  Patient with twitch response noted during dry needling and reports decrease in pain to 3/10 following.  Pt continues to require skilled PT to progress towards goal related activities.   OBJECTIVE IMPAIRMENTS decreased cognition, decreased ROM, decreased strength, increased fascial restrictions, increased muscle spasms, impaired flexibility, postural dysfunction, and pain.   ACTIVITY LIMITATIONS carrying, lifting, sleeping, transfers, bed mobility, dressing, reach over head, and hygiene/grooming  PARTICIPATION LIMITATIONS: meal prep, cleaning, laundry, driving, shopping, community activity, occupation, and yard work  PERSONAL FACTORS Behavior pattern, Fitness, Past/current experiences, Time since onset of injury/illness/exacerbation, and 1-2 comorbidities: insomnia,  are also affecting patient's functional outcome.   REHAB POTENTIAL: Good  CLINICAL DECISION MAKING: Stable/uncomplicated  EVALUATION COMPLEXITY: Low   GOALS: Goals reviewed with patient? Yes  SHORT TERM GOALS: Target date: 09/10/2022   Patient will be independent with initial HEP  Baseline: na Goal status: IN PROGRESS  2.  Pain report to be no greater than 4/10  Baseline: na Goal status: IN PROGRESS  3.  Patient to be able to fall  asleep no later than 10pm and sleep consecutive 4 hours Baseline: na Goal status: INITIAL   LONG TERM GOALS: Target date: 10/08/2022  Patient to be independent with advanced HEP  Baseline: na Goal status: INITIAL  2.  Patient to report pain no greater than 2/10  Baseline: na Goal status: INITIAL  3.  Patient to be able to sleep through the night  Baseline: na Goal status: INITIAL  4.  Patient to begin and maintain a consistent exercise regimen Baseline: na Goal status: INITIAL  5.  Cervical ROM to be [facility] Baseline: na Goal status: INITIAL  6.  UE and postural strength to be 4+/5 Baseline: na Goal status: INITIAL   PLAN: PT FREQUENCY: 1-2x/week  PT DURATION: 8 weeks  PLANNED INTERVENTIONS: Therapeutic exercises, Therapeutic activity, Neuromuscular re-education, Balance training, Patient/Family education, Self Care, Joint mobilization, Aquatic Therapy, Dry Needling, Electrical stimulation, Spinal mobilization, Cryotherapy, Moist heat, scar mobilization, Taping, Traction, Ultrasound, Ionotophoresis 4mg /ml Dexamethasone, Manual therapy, and Re-evaluation  PLAN FOR NEXT SESSION: Review HEP, Assess response to dry needling, progress postural strength   , PT 08/15/22 10:15 AM   [facility] [address] [place], [place] Phone # [phone] Fax [phone]

## 2022-08-19 ENCOUNTER — Ambulatory Visit: Payer: Medicare Other

## 2022-08-19 DIAGNOSIS — M6281 Muscle weakness (generalized): Secondary | ICD-10-CM | POA: Diagnosis not present

## 2022-08-19 DIAGNOSIS — R293 Abnormal posture: Secondary | ICD-10-CM | POA: Diagnosis not present

## 2022-08-19 DIAGNOSIS — M542 Cervicalgia: Secondary | ICD-10-CM

## 2022-08-19 DIAGNOSIS — R252 Cramp and spasm: Secondary | ICD-10-CM | POA: Diagnosis not present

## 2022-08-19 NOTE — Therapy (Signed)
OUTPATIENT PHYSICAL THERAPY TREATMENT NOTE   Patient Name: Charlene Hunt MRN: [id number] DOB:02-09-1951, 71 y.o., female Today's Date: 08/19/2022   PT End of Session - 08/19/22 1315     Visit Number 3    Date for PT Re-Evaluation 10/08/22    Authorization Type [employer]ICARE    Progress Note Due on Visit 10    PT Start Time 1232    PT Stop Time 1315    PT Time Calculation (min) 43 min    Activity Tolerance Patient tolerated treatment well    Behavior During Therapy WFL for tasks assessed/performed              Past Medical History:  Diagnosis Date   Abnormal finding on thyroid function test    Acid reflux    ADD (attention deficit disorder)    Allergies    Anxiety    Asymptomatic menopausal state    Current every day vaping    Depression    Diverticulosis    Elevated coronary artery calcium score    Endometrial disorder    Erythema nodosum    Family history of breast cancer    Flat feet    GERD (gastroesophageal reflux disease)    H/O menorrhagia    Herpesviral infection of urogenital system    Hypothyroidism    Insomnia    Memory impairment    Migraine    OSA (obstructive sleep apnea)    Osteopenia    Plantar fasciitis    Sleep disorder    Vitamin D deficiency    Past Surgical History:  Procedure Laterality Date   C SECTIONS     CATARACTS     DILATION AND CURETTAGE OF UTERUS     ENDOMETRIAL ABLATION     NASAL SEPTOPLASTY FOR DEVIATED SEPTUM     TONSILLECTOMY     TORN RETINA     [place]     Patient Active Problem List   Diagnosis Date Noted   Cervicalgia 08/13/2022    PCP: Glenis Smoker, MD  REFERRING PROVIDER: Inez Catalina, MD   REFERRING DIAG: M54.2 (ICD-10-CM) - Cervicalgia   THERAPY DIAG:  Cervicalgia  Cramp and spasm  Muscle weakness (generalized)  Abnormal posture  Rationale for Evaluation and Treatment Rehabilitation  ONSET DATE: 08/08/2022   SUBJECTIVE:                                                                                                                                                                                                          SUBJECTIVE STATEMENT:  I felt achy all over after DN and my skin felt sensitive to my clothes as well.    PERTINENT HISTORY:  Sleep apnea, insomnia, excessive stress  PAIN:  Are you having pain? Yes: NPRS scale: 6/10 Pain location: left side of neck and upper trap Pain description: aching Aggravating factors: nothing in particular Relieving factors: Tylenol, heat  PRECAUTIONS: None  WEIGHT BEARING RESTRICTIONS No  FALLS:  Has patient fallen in last 6 months? Yes. Number of falls was carrying her grandson up some steps and he was writhing around.   OCCUPATION: retired Systems developer  PLOF: Independent, Independent with basic ADLs, Independent with household mobility without device, Independent with community mobility without device, Independent with homemaking with ambulation, Independent with gait, and Independent with transfers  PATIENT GOALS   Less pain  OBJECTIVE:   DIAGNOSTIC FINDINGS:  na  PATIENT SURVEYS:  Eval:  FOTO 56 goal is 39)   COGNITION: Overall cognitive status: Within functional limits for tasks assessed   SENSATION: WFL Patient reports history of some symptoms associated with carpal tunnel bilaterally and left thumb trigger finger,  also hx of mild left shoulder injury in yoga.   POSTURE: rounded shoulders and forward head slight kyphosis  PALPATION: Tender, tight bands, trigger points bilateral upper traps, parascapular areas Left > right    CERVICAL ROM:   Active ROM A/PROM (deg) eval  Flexion WNL  Extension 50  Right lateral flexion 32  Left lateral flexion 30  Right rotation 42  Left rotation 45   (Blank rows = not tested)  UPPER EXTREMITY ROM:  WNL  UPPER EXTREMITY MMT:  Eval:  Left shoulder ER and scaption with IR both 3+/5,  all others generally  4+/5  CERVICAL SPECIAL TESTS:  Eval:  Spurling's test: Negative   FUNCTIONAL TESTS:  Eval:  5 times sit to stand : 14.06 sec  TODAY'S TREATMENT:  08/19/2022: UBE level 1.0 x3 min each direction with PT present to discuss status Seated cervical retraction and scapular retraction 2x10 each Standing rows and shoulder extension: red 2x10 bil each Seated upper trap and levator stretch 2x20 sec bilat Seated shoulder ER and horizontal abduction with red tband 2x10 Trigger Point Dry-Needling  Treatment instructions: Expect mild to moderate muscle soreness. S/S of pneumothorax if dry needled over a lung field, and to seek immediate medical attention should they occur. Patient verbalized understanding of these instructions and education. Patient Consent Given: Yes Education handout provided: Previously provided Muscles treated: Bilat suboccipitals, left cervical multifidi, left upper trap Electrical stimulation performed: No Parameters: N/A Treatment response/outcome: Utilized skilled palpation to identify trigger points.  Able to palpate muscle twitch and muscle elongation following dry needling.    08/15/2022: UBE level 1.0 x3 min each direction with PT present to discuss status Seated cervical retraction and scapular retraction 2x10 each Cervical rotation and extension.  X15 each Seated upper trap and levator stretch 2x20 sec bilat Seated shoulder ER and horizontal abduction with red tband 2x10 Trigger Point Dry-Needling  Treatment instructions: Expect mild to moderate muscle soreness. S/S of pneumothorax if dry needled over a lung field, and to seek immediate medical attention should they occur. Patient verbalized understanding of these instructions and education. Patient Consent Given: Yes Education handout provided: Previously provided Muscles treated: Bilat suboccipitals, left cervical multifidi, left upper trap Electrical stimulation performed: No Parameters: N/A Treatment  response/outcome: Utilized skilled palpation to identify trigger points.  Able to palpate muscle twitch and muscle elongation following dry needling.  PATIENT EDUCATION:  Education details: Initiated HEP, educated on dry needling and provided handout Person educated: Patient Education method: Programmer, multimedia, Facilities manager, Verbal cues, and Handouts Education comprehension: verbalized understanding, returned demonstration, and verbal cues required  HOME EXERCISE PROGRAM: Access Code: [email or web address] URL: [email or web address] Date: 08/15/2022 Prepared by: Clydie Braun Menke  Exercises - Seated Upper Trapezius Stretch  - 1 x daily - 7 x weekly - 1 sets - 2 reps - 20 sec hold - Seated Levator Scapulae Stretch  - 1 x daily - 7 x weekly - 1 sets - 2 reps - 20 sec hold - Seated Scapular Retraction  - 1 x daily - 7 x weekly - 3 sets - 10 reps - Shoulder Rolls in Sitting  - 1 x daily - 7 x weekly - 3 sets - 10 reps - Seated Cervical Retraction  - 1 x daily - 7 x weekly - 3 sets - 10 reps - Seated Cervical Rotation AROM  - 1 x daily - 7 x weekly - 3 sets - 10 reps - Shoulder extension with resistance - Neutral  - 1 x daily - 7 x weekly - 3 sets - 10 reps - Standing Shoulder Row with Anchored Resistance  - 1 x daily - 7 x weekly - 3 sets - 10 reps - Seated Shoulder Horizontal Abduction with Resistance  - 1 x daily - 7 x weekly - 3 sets - 10 reps - Shoulder External Rotation and Scapular Retraction with Resistance  - 1 x daily - 7 x weekly - 2 sets - 10 reps  ASSESSMENT:  CLINICAL IMPRESSION: Pt arrived with report that she felt achy and sensitive to her clothes after DN.  She had a couple of days where she didn't have to take Tylenol after DN.  Pt reports compliance with HEP and she did well with these in the clinic today.  Pt denies any significant change in pain since the start of care but did report improved cervical sidebending after needling.  Pt with tension and trigger points in Lt upper  traps and neck and had good response to DN with twitch response and improved tissue mobility after.  Patient will benefit from skilled PT to address the below impairments and improve overall function.    OBJECTIVE IMPAIRMENTS decreased cognition, decreased ROM, decreased strength, increased fascial restrictions, increased muscle spasms, impaired flexibility, postural dysfunction, and pain.   ACTIVITY LIMITATIONS carrying, lifting, sleeping, transfers, bed mobility, dressing, reach over head, and hygiene/grooming  PARTICIPATION LIMITATIONS: meal prep, cleaning, laundry, driving, shopping, community activity, occupation, and yard work  PERSONAL FACTORS Behavior pattern, Fitness, Past/current experiences, Time since onset of injury/illness/exacerbation, and 1-2 comorbidities: insomnia,  are also affecting patient's functional outcome.   REHAB POTENTIAL: Good  CLINICAL DECISION MAKING: Stable/uncomplicated  EVALUATION COMPLEXITY: Low   GOALS: Goals reviewed with patient? Yes  SHORT TERM GOALS: Target date: 09/10/2022   Patient will be independent with initial HEP  Baseline: na Goal status: IN PROGRESS  2.  Pain report to be no greater than 4/10  Baseline: 6/10 (1/023/23) Goal status: in progress   3.  Patient to be able to fall asleep no later than 10pm and sleep consecutive 4 hours Baseline: na Goal status: INITIAL   LONG TERM GOALS: Target date: 10/08/2022  Patient to be independent with advanced HEP  Baseline: na Goal status: INITIAL  2.  Patient to report pain no greater than 2/10  Baseline: na Goal status: INITIAL  3.  Patient to be able to sleep  through the night  Baseline: na Goal status: INITIAL  4.  Patient to begin and maintain a consistent exercise regimen Baseline: na Goal status: INITIAL  5.  Cervical ROM to be [facility] Baseline: na Goal status: INITIAL  6.  UE and postural strength to be 4+/5 Baseline: na Goal status: INITIAL   PLAN: PT FREQUENCY:  1-2x/week  PT DURATION: 8 weeks  PLANNED INTERVENTIONS: Therapeutic exercises, Therapeutic activity, Neuromuscular re-education, Balance training, Patient/Family education, Self Care, Joint mobilization, Aquatic Therapy, Dry Needling, Electrical stimulation, Spinal mobilization, Cryotherapy, Moist heat, scar mobilization, Taping, Traction, Ultrasound, Ionotophoresis 4mg /ml Dexamethasone, Manual therapy, and Re-evaluation  PLAN FOR NEXT SESSION: Discuss DN again, progress postural strength  , PT 08/19/22 1:18 PM   [facility] [address], [address] [place], [place] Phone # [phone] Fax [phone]

## 2022-08-22 ENCOUNTER — Ambulatory Visit: Payer: Medicare Other | Attending: Internal Medicine | Admitting: Internal Medicine

## 2022-08-22 ENCOUNTER — Encounter: Payer: Self-pay | Admitting: Internal Medicine

## 2022-08-22 VITALS — BP 122/58 | HR 82 | Ht 60.0 in | Wt 166.8 lb

## 2022-08-22 DIAGNOSIS — E785 Hyperlipidemia, unspecified: Secondary | ICD-10-CM

## 2022-08-22 DIAGNOSIS — Z79899 Other long term (current) drug therapy: Secondary | ICD-10-CM

## 2022-08-22 NOTE — Progress Notes (Signed)
Cardiology Office Note   Date:  08/22/2022   ID:  Charlene Hunt, Charlene Hunt [birth year], MRN [id number]  PCP:  Glenis Smoker, MD  Cardiologist:   Dorris Carnes, MD   Patient presents for evaluation of CAD     History of Present Illness: Charlene Hunt is a 71 y.o. female with a history of HL    She is followed by Quinn Axe     She had a Ca score CT  Score 141 (73rd percentile)  47 LAD 6 LCx    The pt denies CP  NoSOB   Does feel tired a lot    Does not exercise regularly     Just started statin  Initially reluctant for worry of muscle problmes    She says she feels better   Diet: Lunch   Br Smoothy (carrot, tumeric juice, berries, 2 or 3 spoons nonfat greek yogur, collagen powder  and  Lunch  Left overs  Salad   Prepared meals   [employer]  Emergency planning/management officer:   Same  Drinks   Water, juice      Current Meds  Medication Sig   albuterol (VENTOLIN HFA) 108 (90 Base) MCG/ACT inhaler Inhale into the lungs every 6 (six) hours as needed for wheezing or shortness of breath.   fluticasone (FLONASE) 50 MCG/ACT nasal spray Place 1 spray into both nostrils at bedtime.   loratadine (CLARITIN) 10 MG tablet Take 10 mg by mouth daily as needed for allergies.   omeprazole (PRILOSEC) 10 MG capsule Take 10 mg by mouth at bedtime. Sinus issues/heart burn   rosuvastatin (CRESTOR) 10 MG tablet Take 10 mg by mouth daily.     Allergies:   Fish allergy and Penicillins   Past Medical History:  Diagnosis Date   Abnormal finding on thyroid function test    Acid reflux    ADD (attention deficit disorder)    Allergies    Anxiety    Asymptomatic menopausal state    Current every day vaping    Depression    Diverticulosis    Elevated coronary artery calcium score    Endometrial disorder    Erythema nodosum    Family history of breast cancer    Flat feet    GERD (gastroesophageal reflux disease)    H/O menorrhagia    Herpesviral infection of urogenital system     Hypothyroidism    Insomnia    Memory impairment    Migraine    OSA (obstructive sleep apnea)    Osteopenia    Plantar fasciitis    Sleep disorder    Vitamin D deficiency     Past Surgical History:  Procedure Laterality Date   C SECTIONS     CATARACTS     DILATION AND CURETTAGE OF UTERUS     ENDOMETRIAL ABLATION     NASAL SEPTOPLASTY FOR DEVIATED SEPTUM     TONSILLECTOMY     TORN RETINA     [place]       Social History:  The patient  reports that she has been smoking e-cigarettes. She has never used smokeless tobacco.   Family History:  The patient's family history includes Breast cancer in her sister; Heart Problems in her father; Osteoporosis in her mother; Pancreatic cancer in her brother.    ROS:  Please see the history of present illness. All other systems are reviewed and  Negative to the above problem except as noted.    PHYSICAL  EXAM: VS:  BP (!) 122/58   Pulse 82   Ht 5' (1.524 m)   Wt 166 lb 12.8 oz (75.7 kg)   SpO2 96%   BMI 32.58 kg/m   BOF:BPZWC 71 yo  in no acute distress  HEENT: normal  Neck: no JVD, carotid bruits Cardiac: RRR; no murmurs  No LE  edema  Respiratory:  clear to auscultation bilaterally GI: soft, nontender, nondistended, + BS  No hepatomegaly  MS: no deformity Moving all extremities   Skin: warm and dry, no rash Neuro:  Strength and sensation are intact Psych: euthymic mood, full affect   EKG:  EKG is ordered today.  SR 82 bpm  Nonspecific ST changes      Lipid Panel No results found for: "CHOL", "TRIG", "HDL", "CHOLHDL", "VLDL", "LDLCALC", "LDLDIRECT"    Wt Readings from Last 3 Encounters:  08/22/22 166 lb 12.8 oz (75.7 kg)  09/10/21 169 lb (76.7 kg)      ASSESSMENT AND PLAN:  1  CAD   Mild on CT scan   I would recomm ecASA   Need to aggressively control risk factors  2  HL  Will check lipids today   3  Diet   Discussed  Limit carbs   Lots of veggeis, minimally processed food     Current medicines are reviewed  at length with the patient today.  The patient does not have concerns regarding medicines.  Signed, Dorris Carnes, MD  08/22/2022 11:27 AM    [address], [place], [place]  [address] Phone: [phone]; Fax: [phone]

## 2022-08-22 NOTE — Patient Instructions (Signed)
Medication Instructions:   *If you need a refill on your cardiac medications before your next appointment, please call your pharmacy*   Lab Work: HGBA1C, NMR, APO B, LIPO A, HEPATIC IN DECEMBER FASTING   If you have labs (blood work) drawn today and your tests are completely normal, you will receive your results only by: [place] (if you have MyChart) OR A paper copy in the mail If you have any lab test that is abnormal or we need to change your treatment, we will call you to review the results.   Testing/Procedures:    Follow-Up: At [facility], you and your health needs are our priority.  As part of our continuing mission to provide you with exceptional heart care, we have created designated Provider Care Teams.  These Care Teams include your primary Cardiologist (physician) and Advanced Practice Providers (APPs -  Physician Assistants and Nurse Practitioners) who all work together to provide you with the care you need, when you need it.  We recommend signing up for the patient portal called "MyChart".  Sign up information is provided on this After Visit Summary.  MyChart is used to connect with patients for Virtual Visits (Telemedicine).  Patients are able to view lab/test results, encounter notes, upcoming appointments, etc.  Non-urgent messages can be sent to your provider as well.   To learn more about what you can do with MyChart, go to [email or web address].    Your next appointment:   1 year(s)  The format for your next appointment:   In Person  Provider:  DR Dorris Carnes      Other Instructions   Important Information About Sugar

## 2022-08-27 ENCOUNTER — Encounter: Payer: Self-pay | Admitting: Rehabilitative and Restorative Service Providers"

## 2022-08-27 ENCOUNTER — Ambulatory Visit: Payer: Medicare Other | Admitting: Rehabilitative and Restorative Service Providers"

## 2022-08-27 DIAGNOSIS — R252 Cramp and spasm: Secondary | ICD-10-CM

## 2022-08-27 DIAGNOSIS — M542 Cervicalgia: Secondary | ICD-10-CM

## 2022-08-27 DIAGNOSIS — R293 Abnormal posture: Secondary | ICD-10-CM | POA: Diagnosis not present

## 2022-08-27 DIAGNOSIS — M6281 Muscle weakness (generalized): Secondary | ICD-10-CM | POA: Diagnosis not present

## 2022-08-27 NOTE — Therapy (Signed)
OUTPATIENT PHYSICAL THERAPY TREATMENT NOTE   Patient Name: Charlene Hunt MRN: [id number] DOB:Jul 18, 1951, 71 y.o., female Today's Date: 08/27/2022   PT End of Session - 08/27/22 1101     Visit Number 4    Date for PT Re-Evaluation 10/08/22    Authorization Type [employer]ICARE    Progress Note Due on Visit 10    PT Start Time 1100    PT Stop Time 1140    PT Time Calculation (min) 40 min    Activity Tolerance Patient tolerated treatment well    Behavior During Therapy WFL for tasks assessed/performed              Past Medical History:  Diagnosis Date   Abnormal finding on thyroid function test    Acid reflux    ADD (attention deficit disorder)    Allergies    Anxiety    Asymptomatic menopausal state    Current every day vaping    Depression    Diverticulosis    Elevated coronary artery calcium score    Endometrial disorder    Erythema nodosum    Family history of breast cancer    Flat feet    GERD (gastroesophageal reflux disease)    H/O menorrhagia    Herpesviral infection of urogenital system    Hypothyroidism    Insomnia    Memory impairment    Migraine    OSA (obstructive sleep apnea)    Osteopenia    Plantar fasciitis    Sleep disorder    Vitamin D deficiency    Past Surgical History:  Procedure Laterality Date   C SECTIONS     CATARACTS     DILATION AND CURETTAGE OF UTERUS     ENDOMETRIAL ABLATION     NASAL SEPTOPLASTY FOR DEVIATED SEPTUM     TONSILLECTOMY     TORN RETINA     [place]     Patient Active Problem List   Diagnosis Date Noted   Cervicalgia 08/13/2022    PCP: Glenis Smoker, MD  REFERRING PROVIDER: Inez Catalina, MD   REFERRING DIAG: M54.2 (ICD-10-CM) - Cervicalgia   THERAPY DIAG:  Cervicalgia  Cramp and spasm  Muscle weakness (generalized)  Abnormal posture  Rationale for Evaluation and Treatment Rehabilitation  ONSET DATE: 08/08/2022   SUBJECTIVE:                                                                                                                                                                                                          SUBJECTIVE STATEMENT:  Pt reports feeling better after last dry needling session, but states some pain this morning increased from what it has been.  PERTINENT HISTORY:  Sleep apnea, insomnia, excessive stress  PAIN:  Are you having pain? Yes: NPRS scale: 6/10 Pain location: left side of neck and upper trap Pain description: aching Aggravating factors: nothing in particular Relieving factors: Tylenol, heat  PRECAUTIONS: None  WEIGHT BEARING RESTRICTIONS No  FALLS:  Has patient fallen in last 6 months? Yes. Number of falls was carrying her grandson up some steps and he was writhing around.   OCCUPATION: retired Agricultural engineer  PLOF: Independent, Independent with basic ADLs, Independent with household mobility without device, Independent with community mobility without device, Independent with homemaking with ambulation, Independent with gait, and Independent with transfers  PATIENT GOALS   Less pain  OBJECTIVE:   DIAGNOSTIC FINDINGS:  na  PATIENT SURVEYS:  Eval:  FOTO 56 (goal is 15)   COGNITION: Overall cognitive status: Within functional limits for tasks assessed   SENSATION: WFL Patient reports history of some symptoms associated with carpal tunnel bilaterally and left thumb trigger finger,  also hx of mild left shoulder injury in yoga.   POSTURE: rounded shoulders and forward head slight kyphosis  PALPATION: Tender, tight bands, trigger points bilateral upper traps, parascapular areas Left > right    CERVICAL ROM:   Active ROM A/PROM (deg) eval  Flexion WNL  Extension 50  Right lateral flexion 32  Left lateral flexion 30  Right rotation 42  Left rotation 45   (Blank rows = not tested)  UPPER EXTREMITY ROM:  WNL  UPPER EXTREMITY MMT:  Eval:  Left shoulder ER and scaption with IR  both 3+/5,  all others generally 4+/5  CERVICAL SPECIAL TESTS:  Eval:  Spurling's test: Negative   FUNCTIONAL TESTS:  Eval:  5 times sit to stand : 14.06 sec  TODAY'S TREATMENT:  08/27/2022: UBE level 1.0 x3 min each direction with PT present to discuss status Seated cervical retraction and scapular retraction 2x10 each Cervical rotation and extension.  X15 each Seated upper trap and levator stretch 2x20 sec bilat Seated shoulder ER and horizontal abduction with red tband 2x10 Standing rows and shoulder extension: red 2x10 bil each Trigger Point Dry-Needling  Treatment instructions: Expect mild to moderate muscle soreness. S/S of pneumothorax if dry needled over a lung field, and to seek immediate medical attention should they occur. Patient verbalized understanding of these instructions and education. Patient Consent Given: Yes Education handout provided: Previously provided Muscles treated: Bilat suboccipitals, left cervical multifidi, bilat upper trap Electrical stimulation performed: No Parameters: N/A Treatment response/outcome: Utilized skilled palpation to identify trigger points.  Able to palpate muscle twitch and muscle elongation following dry needling.    08/19/2022: UBE level 1.0 x3 min each direction with PT present to discuss status Seated cervical retraction and scapular retraction 2x10 each Standing rows and shoulder extension: red 2x10 bil each Seated upper trap and levator stretch 2x20 sec bilat Seated shoulder ER and horizontal abduction with red tband 2x10 Trigger Point Dry-Needling  Treatment instructions: Expect mild to moderate muscle soreness. S/S of pneumothorax if dry needled over a lung field, and to seek immediate medical attention should they occur. Patient verbalized understanding of these instructions and education. Patient Consent Given: Yes Education handout provided: Previously provided Muscles treated: Bilat suboccipitals, left cervical  multifidi, left upper trap Electrical stimulation performed: No Parameters: N/A Treatment response/outcome: Utilized skilled palpation to identify trigger points.  Able to palpate  muscle twitch and muscle elongation following dry needling.    08/15/2022: UBE level 1.0 x3 min each direction with PT present to discuss status Seated cervical retraction and scapular retraction 2x10 each Cervical rotation and extension.  X15 each Seated upper trap and levator stretch 2x20 sec bilat Seated shoulder ER and horizontal abduction with red tband 2x10 Trigger Point Dry-Needling  Treatment instructions: Expect mild to moderate muscle soreness. S/S of pneumothorax if dry needled over a lung field, and to seek immediate medical attention should they occur. Patient verbalized understanding of these instructions and education. Patient Consent Given: Yes Education handout provided: Previously provided Muscles treated: Bilat suboccipitals, left cervical multifidi, left upper trap Electrical stimulation performed: No Parameters: N/A Treatment response/outcome: Utilized skilled palpation to identify trigger points.  Able to palpate muscle twitch and muscle elongation following dry needling.    PATIENT EDUCATION:  Education details: Initiated HEP, educated on dry needling and provided handout Person educated: Patient Education method: Consulting civil engineer, Media planner, Verbal cues, and Handouts Education comprehension: verbalized understanding, returned demonstration, and verbal cues required  HOME EXERCISE PROGRAM: Access Code: [email or web address] URL: [email or web address] Date: 08/15/2022 Prepared by: Shelby Dubin Avleen Bordwell  Exercises - Seated Upper Trapezius Stretch  - 1 x daily - 7 x weekly - 1 sets - 2 reps - 20 sec hold - Seated Levator Scapulae Stretch  - 1 x daily - 7 x weekly - 1 sets - 2 reps - 20 sec hold - Seated Scapular Retraction  - 1 x daily - 7 x weekly - 3 sets - 10 reps - Shoulder Rolls in  Sitting  - 1 x daily - 7 x weekly - 3 sets - 10 reps - Seated Cervical Retraction  - 1 x daily - 7 x weekly - 3 sets - 10 reps - Seated Cervical Rotation AROM  - 1 x daily - 7 x weekly - 3 sets - 10 reps - Shoulder extension with resistance - Neutral  - 1 x daily - 7 x weekly - 3 sets - 10 reps - Standing Shoulder Row with Anchored Resistance  - 1 x daily - 7 x weekly - 3 sets - 10 reps - Seated Shoulder Horizontal Abduction with Resistance  - 1 x daily - 7 x weekly - 3 sets - 10 reps - Shoulder External Rotation and Scapular Retraction with Resistance  - 1 x daily - 7 x weekly - 2 sets - 10 reps  ASSESSMENT:  CLINICAL IMPRESSION: Ms Pharo presents to skilled PT stating that she overall has been doing better, but is having some increased pain today, but feels that it could be due to the change in weather.  Pt with noted less trigger points than at the first time this PT performed dry needling.  Patient does continue to have a good twitch response with dry needling noted.  Pt able to increase to green tband for rows and shoulder extension.  Pt continues to require skilled PT to progress towards goal related activities.   OBJECTIVE IMPAIRMENTS decreased cognition, decreased ROM, decreased strength, increased fascial restrictions, increased muscle spasms, impaired flexibility, postural dysfunction, and pain.   ACTIVITY LIMITATIONS carrying, lifting, sleeping, transfers, bed mobility, dressing, reach over head, and hygiene/grooming  PARTICIPATION LIMITATIONS: meal prep, cleaning, laundry, driving, shopping, community activity, occupation, and yard work  PERSONAL FACTORS Behavior pattern, Fitness, Past/current experiences, Time since onset of injury/illness/exacerbation, and 1-2 comorbidities: insomnia,  are also affecting patient's functional outcome.   REHAB POTENTIAL: Good  CLINICAL DECISION MAKING: Stable/uncomplicated  EVALUATION  COMPLEXITY: Low   GOALS: Goals reviewed with patient?  Yes  SHORT TERM GOALS: Target date: 09/10/2022   Patient will be independent with initial HEP  Baseline: na Goal status: MET  2.  Pain report to be no greater than 4/10  Baseline: 6/10 (1/023/23) Goal status: in progress   3.  Patient to be able to fall asleep no later than 10pm and sleep consecutive 4 hours Baseline: na Goal status: INITIAL   LONG TERM GOALS: Target date: 10/08/2022  Patient to be independent with advanced HEP  Baseline: na Goal status: INITIAL  2.  Patient to report pain no greater than 2/10  Baseline: na Goal status: INITIAL  3.  Patient to be able to sleep through the night  Baseline: na Goal status: INITIAL  4.  Patient to begin and maintain a consistent exercise regimen Baseline: na Goal status: INITIAL  5.  Cervical ROM to be [facility] Baseline: na Goal status: INITIAL  6.  UE and postural strength to be 4+/5 Baseline: na Goal status: INITIAL   PLAN: PT FREQUENCY: 1-2x/week  PT DURATION: 8 weeks  PLANNED INTERVENTIONS: Therapeutic exercises, Therapeutic activity, Neuromuscular re-education, Balance training, Patient/Family education, Self Care, Joint mobilization, Aquatic Therapy, Dry Needling, Electrical stimulation, Spinal mobilization, Cryotherapy, Moist heat, scar mobilization, Taping, Traction, Ultrasound, Ionotophoresis 11m/ml Dexamethasone, Manual therapy, and Re-evaluation  PLAN FOR NEXT SESSION: Manual/Dry needling as indicated, flexibility, progress postural strength   SJuel Burrow PT 08/27/22 11:45 AM   [facility] [address] [place] [address]Phone # [phone] [phone]

## 2022-09-03 IMAGING — MG MM DIGITAL SCREENING BILAT W/ TOMO AND CAD
6 of 10 series · 6 of 30 positions shown · non-contrast
Comparison: Previous exam(s).

CLINICAL DATA: Screening.

EXAM:
DIGITAL SCREENING BILATERAL MAMMOGRAM WITH TOMOSYNTHESIS AND CAD
TECHNIQUE: Bilateral screening digital craniocaudal and mediolateral oblique
mammograms were obtained. Bilateral screening digital breast
tomosynthesis was performed. The images were evaluated with
computer-aided detection.

[L MLO synth-2D]
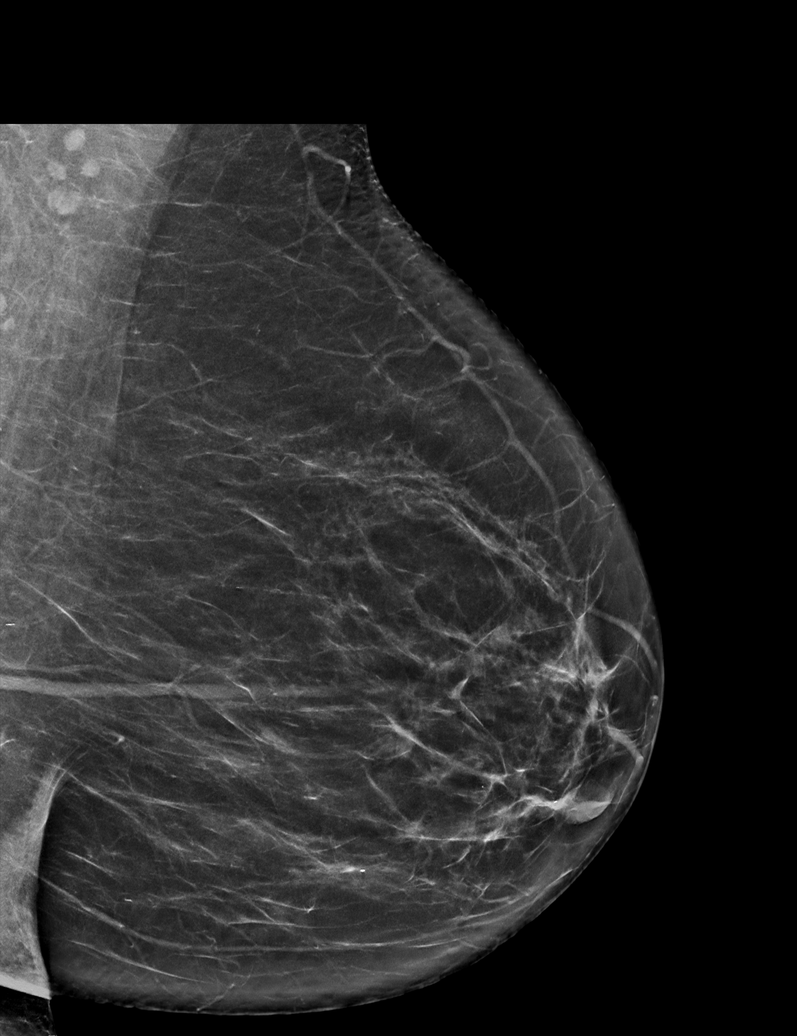

[R CC synth-2D]
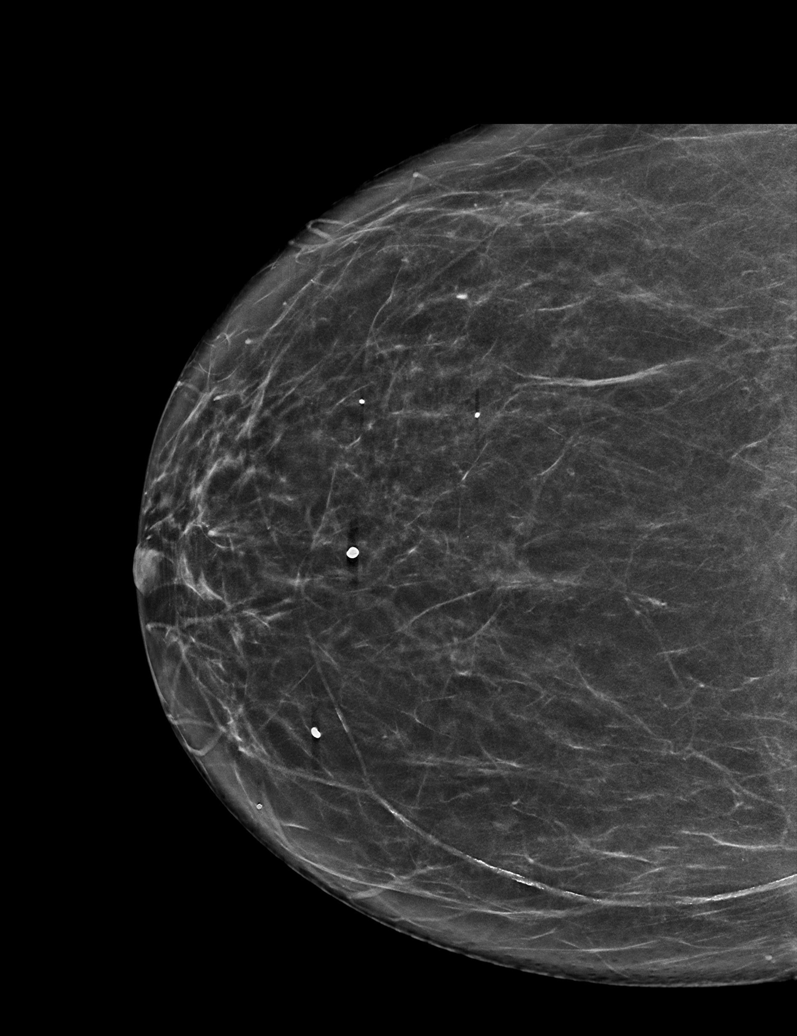

[L CC synth-2D (1 of 2)]
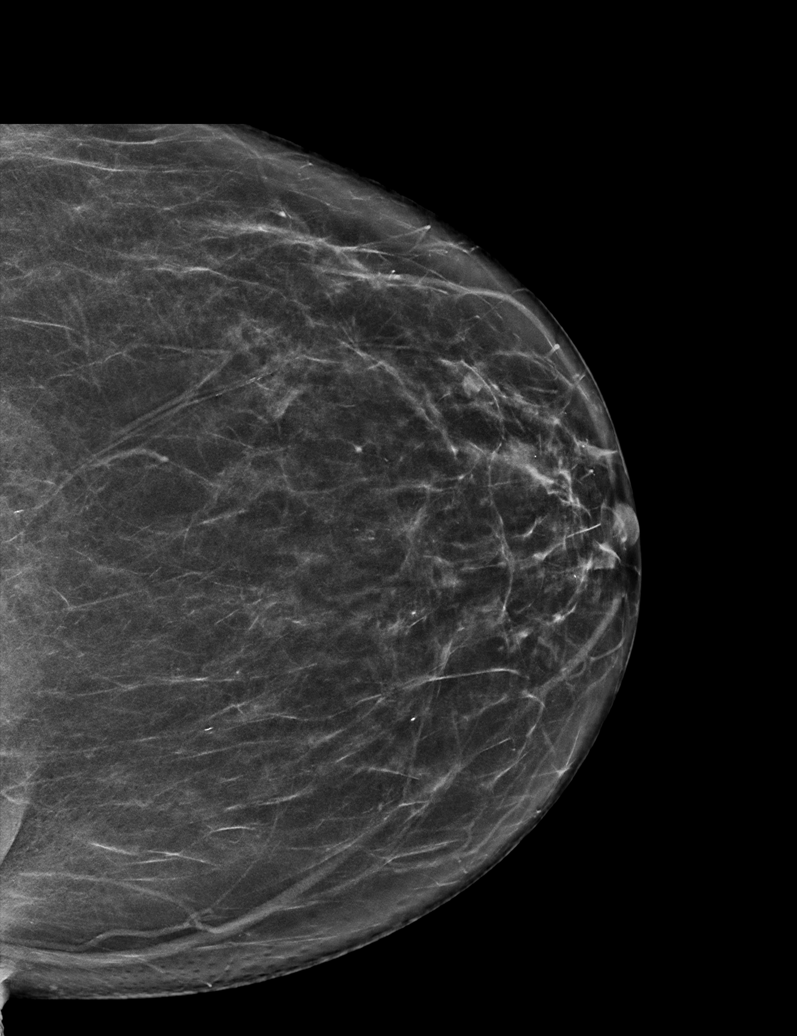

[R MLO synth-2D]
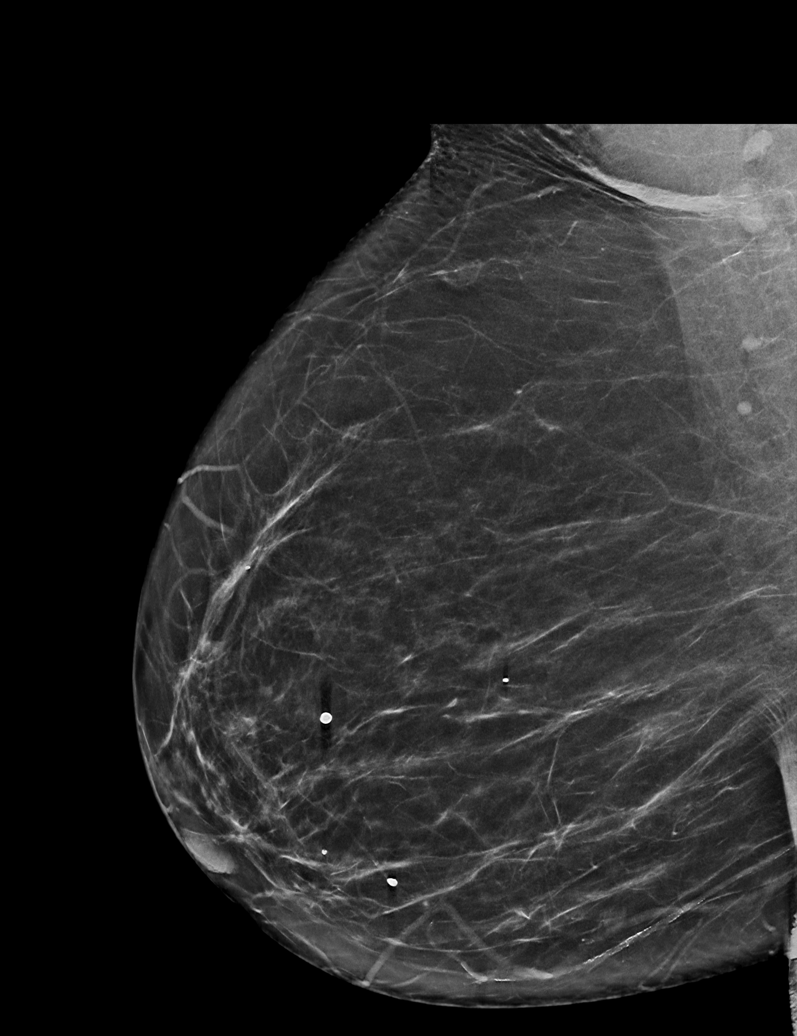

[L CC synth-2D (2 of 2)]
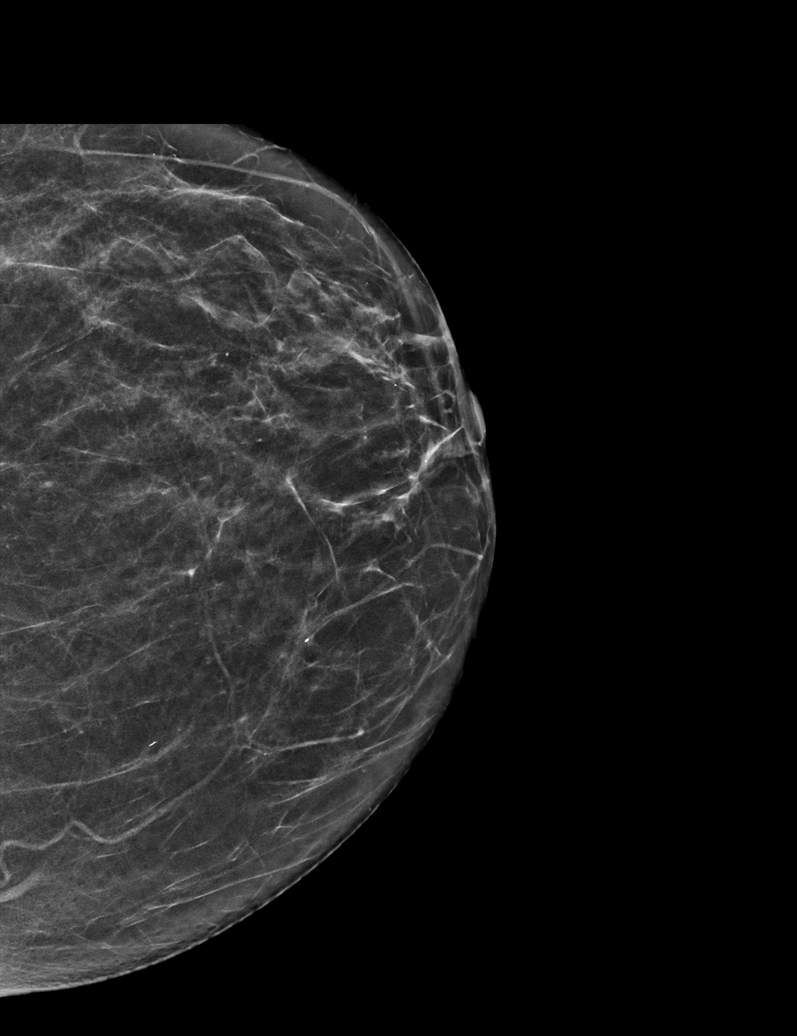

[L CC tomo · tomo slice 32/63.0]
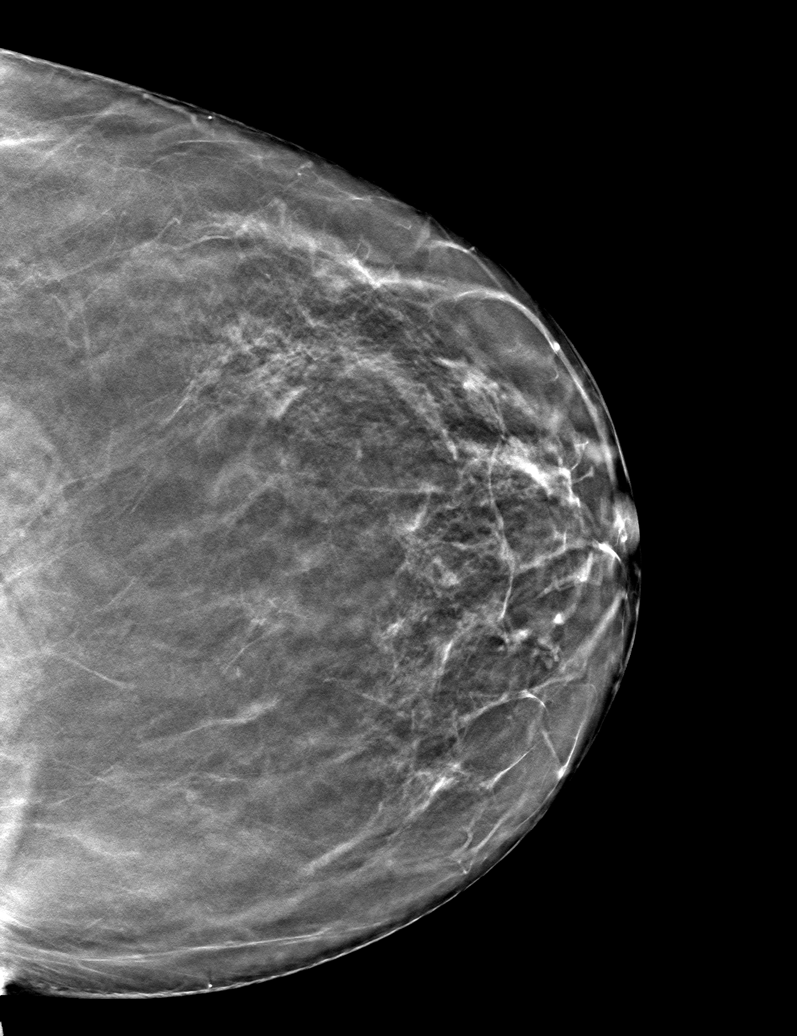

[6 of 30 positions shown; findings below may reference images not displayed]

ACR Breast Density Category b: There are scattered areas of
fibroglandular density.
FINDINGS: There are no findings suspicious for malignancy.
IMPRESSION: No mammographic evidence of malignancy. A result letter of this
screening mammogram will be mailed directly to the patient.

RECOMMENDATION:
Screening mammogram in one year. (Code:51-O-LD2)

BI-RADS CATEGORY  1: Negative.

## 2022-09-04 ENCOUNTER — Ambulatory Visit: Payer: Medicare Other | Admitting: Rehabilitative and Restorative Service Providers"

## 2022-09-09 ENCOUNTER — Encounter: Payer: Self-pay | Admitting: Rehabilitative and Restorative Service Providers"

## 2022-09-09 ENCOUNTER — Ambulatory Visit: Payer: Medicare Other | Attending: Sports Medicine | Admitting: Rehabilitative and Restorative Service Providers"

## 2022-09-09 DIAGNOSIS — R252 Cramp and spasm: Secondary | ICD-10-CM

## 2022-09-09 DIAGNOSIS — R293 Abnormal posture: Secondary | ICD-10-CM

## 2022-09-09 DIAGNOSIS — M6281 Muscle weakness (generalized): Secondary | ICD-10-CM | POA: Diagnosis not present

## 2022-09-09 DIAGNOSIS — M542 Cervicalgia: Secondary | ICD-10-CM | POA: Diagnosis not present

## 2022-09-09 NOTE — Therapy (Signed)
OUTPATIENT PHYSICAL THERAPY TREATMENT NOTE   Patient Name: Charlene Hunt MRN: [id number] DOB:[birth year], 71 y.o., female Today's Date: 09/09/2022   PT End of Session - 09/09/22 1017     Visit Number 5    Date for PT Re-Evaluation 10/08/22    Authorization Type [employer] MEDICARE    Progress Note Due on Visit 10    PT Start Time 1015    PT Stop Time 1055    PT Time Calculation (min) 40 min    Activity Tolerance Patient tolerated treatment well    Behavior During Therapy WFL for tasks assessed/performed              Past Medical History:  Diagnosis Date   Abnormal finding on thyroid function test    Acid reflux    ADD (attention deficit disorder)    Allergies    Anxiety    Asymptomatic menopausal state    Current every day vaping    Depression    Diverticulosis    Elevated coronary artery calcium score    Endometrial disorder    Erythema nodosum    Family history of breast cancer    Flat feet    GERD (gastroesophageal reflux disease)    H/O menorrhagia    Herpesviral infection of urogenital system    Hypothyroidism    Insomnia    Memory impairment    Migraine    OSA (obstructive sleep apnea)    Osteopenia    Plantar fasciitis    Sleep disorder    Vitamin D deficiency    Past Surgical History:  Procedure Laterality Date   C SECTIONS     CATARACTS     DILATION AND CURETTAGE OF UTERUS     ENDOMETRIAL ABLATION     NASAL SEPTOPLASTY FOR DEVIATED SEPTUM     TONSILLECTOMY     TORN RETINA     [place]     Patient Active Problem List   Diagnosis Date Noted   Cervicalgia 08/13/2022    PCP: Glenis Smoker, MD  REFERRING PROVIDER: Inez Catalina, MD   REFERRING DIAG: M54.2 (ICD-10-CM) - Cervicalgia   THERAPY DIAG:  Cervicalgia  Cramp and spasm  Muscle weakness (generalized)  Abnormal posture  Rationale for Evaluation and Treatment Rehabilitation  ONSET DATE: 08/08/2022   SUBJECTIVE:                                                                                                                                                                                                          SUBJECTIVE STATEMENT:  Pt reports that she had soreness following last PT session and feels like dry needling may have caused some increased soreness.  Pt states that she did not take a Tylenol before she came to PT this morning.  Pt states that she had a bells performance.  PERTINENT HISTORY:  Sleep apnea, insomnia, excessive stress  PAIN:  Are you having pain? Yes: NPRS scale: 7/10 Pain location: left side of neck and upper trap Pain description: aching Aggravating factors: nothing in particular Relieving factors: Tylenol, heat  PRECAUTIONS: None  WEIGHT BEARING RESTRICTIONS No  FALLS:  Has patient fallen in last 6 months? Yes. Number of falls was carrying her grandson up some steps and he was writhing around.   OCCUPATION: retired Agricultural engineer  PLOF: Independent, Independent with basic ADLs, Independent with household mobility without device, Independent with community mobility without device, Independent with homemaking with ambulation, Independent with gait, and Independent with transfers  PATIENT GOALS   Less pain  OBJECTIVE:   DIAGNOSTIC FINDINGS:  na  PATIENT SURVEYS:  Eval:  FOTO 56 (goal is 1)   COGNITION: Overall cognitive status: Within functional limits for tasks assessed   SENSATION: WFL Patient reports history of some symptoms associated with carpal tunnel bilaterally and left thumb trigger finger,  also hx of mild left shoulder injury in yoga.   POSTURE: rounded shoulders and forward head slight kyphosis  PALPATION: Tender, tight bands, trigger points bilateral upper traps, parascapular areas Left > right    CERVICAL ROM:   Active ROM A/PROM (deg) eval A/ROM (Deg) 09/09/22  Flexion WNL 65  Extension 50 50  Right lateral flexion 32 35  Left lateral flexion 30 35  Right  rotation 42 55  Left rotation 45 55   (Blank rows = not tested)  UPPER EXTREMITY ROM:  WNL  UPPER EXTREMITY MMT:  Eval:  Left shoulder ER and scaption with IR both 3+/5,  all others generally 4+/5  CERVICAL SPECIAL TESTS:  Eval:  Spurling's test: Negative   FUNCTIONAL TESTS:  Eval:  5 times sit to stand : 14.06 sec  TODAY'S TREATMENT:  09/09/2022: UBE level 1.0 x3 min each direction with PT present to discuss status Seated upper trap and levator stretch 2x20 sec bilat Seated shoulder ER and horizontal abduction with red tband 2x10 Standing rows and shoulder extension: red 2x10 bil each Seated chin tucks 2x10 Manual Therapy:  soft tissue mobilization to cervical and thoracic paraspinals, upper trap, and rhomboids.  Manual trigger point release in upper traps.  Manual traction 3 x 5 sec with pt reporting relief of symptoms. Cervical rotation and extension.  X15 each Seated shoulder flexion with 1# 2x10   08/27/2022: UBE level 1.0 x3 min each direction with PT present to discuss status Seated cervical retraction and scapular retraction 2x10 each Cervical rotation and extension.  X15 each Seated upper trap and levator stretch 2x20 sec bilat Seated shoulder ER and horizontal abduction with red tband 2x10 Standing rows and shoulder extension: red 2x10 bil each Trigger Point Dry-Needling  Treatment instructions: Expect mild to moderate muscle soreness. S/S of pneumothorax if dry needled over a lung field, and to seek immediate medical attention should they occur. Patient verbalized understanding of these instructions and education. Patient Consent Given: Yes Education handout provided: Previously provided Muscles treated: Bilat suboccipitals, left cervical multifidi, bilat upper trap Electrical stimulation performed: No Parameters: N/A Treatment response/outcome: Utilized skilled palpation to identify trigger points.  Able to palpate muscle twitch and muscle elongation following  dry needling.    08/19/2022: UBE level 1.0 x3 min each direction with PT present to discuss status Seated cervical retraction and scapular retraction 2x10 each Standing rows and shoulder extension: red 2x10 bil each Seated upper trap and levator stretch 2x20 sec bilat Seated shoulder ER and horizontal abduction with red tband 2x10 Trigger Point Dry-Needling  Treatment instructions: Expect mild to moderate muscle soreness. S/S of pneumothorax if dry needled over a lung field, and to seek immediate medical attention should they occur. Patient verbalized understanding of these instructions and education. Patient Consent Given: Yes Education handout provided: Previously provided Muscles treated: Bilat suboccipitals, left cervical multifidi, left upper trap Electrical stimulation performed: No Parameters: N/A Treatment response/outcome: Utilized skilled palpation to identify trigger points.  Able to palpate muscle twitch and muscle elongation following dry needling.     PATIENT EDUCATION:  Education details: Initiated HEP, educated on dry needling and provided handout Person educated: Patient Education method: Consulting civil engineer, Media planner, Verbal cues, and Handouts Education comprehension: verbalized understanding, returned demonstration, and verbal cues required  HOME EXERCISE PROGRAM: Access Code: [email or web address] URL: [email or web address] Date: 08/15/2022 Prepared by: Shelby Dubin Tegan Britain  Exercises - Seated Upper Trapezius Stretch  - 1 x daily - 7 x weekly - 1 sets - 2 reps - 20 sec hold - Seated Levator Scapulae Stretch  - 1 x daily - 7 x weekly - 1 sets - 2 reps - 20 sec hold - Seated Scapular Retraction  - 1 x daily - 7 x weekly - 3 sets - 10 reps - Shoulder Rolls in Sitting  - 1 x daily - 7 x weekly - 3 sets - 10 reps - Seated Cervical Retraction  - 1 x daily - 7 x weekly - 3 sets - 10 reps - Seated Cervical Rotation AROM  - 1 x daily - 7 x weekly - 3 sets - 10 reps - Shoulder  extension with resistance - Neutral  - 1 x daily - 7 x weekly - 3 sets - 10 reps - Standing Shoulder Row with Anchored Resistance  - 1 x daily - 7 x weekly - 3 sets - 10 reps - Seated Shoulder Horizontal Abduction with Resistance  - 1 x daily - 7 x weekly - 3 sets - 10 reps - Shoulder External Rotation and Scapular Retraction with Resistance  - 1 x daily - 7 x weekly - 2 sets - 10 reps  ASSESSMENT:  CLINICAL IMPRESSION: Ms Koone presents to skilled PT stating that she is having soreness since last PT session.  Pt is agreeable to trying soft tissue mobilization this visit instead of dry needling to assess which is more preferable.  Pt did report some relief of symptoms with manual cervical traction.  Pt requires cuing throughout for improved posture and to decrease shoulder shrugging.  Pt reported relief of pain of 4/10 at end of session.  Pt continues to require skilled PT to progress towards goal related activities.   OBJECTIVE IMPAIRMENTS decreased cognition, decreased ROM, decreased strength, increased fascial restrictions, increased muscle spasms, impaired flexibility, postural dysfunction, and pain.   ACTIVITY LIMITATIONS carrying, lifting, sleeping, transfers, bed mobility, dressing, reach over head, and hygiene/grooming  PARTICIPATION LIMITATIONS: meal prep, cleaning, laundry, driving, shopping, community activity, occupation, and yard work  PERSONAL FACTORS Behavior pattern, Fitness, Past/current experiences, Time since onset of injury/illness/exacerbation, and 1-2 comorbidities: insomnia,  are also affecting patient's functional outcome.   REHAB POTENTIAL: Good  CLINICAL DECISION MAKING: Stable/uncomplicated  EVALUATION COMPLEXITY: Low   GOALS: Goals  reviewed with patient? Yes  SHORT TERM GOALS: Target date: 09/10/2022   Patient will be independent with initial HEP  Baseline: na Goal status: MET  2.  Pain report to be no greater than 4/10  Baseline: 6/10 (1/023/23) Goal  status: in progress   3.  Patient to be able to fall asleep no later than 10pm and sleep consecutive 4 hours Baseline: na Goal status: INITIAL   LONG TERM GOALS: Target date: 10/08/2022  Patient to be independent with advanced HEP  Baseline: na Goal status: INITIAL  2.  Patient to report pain no greater than 2/10  Baseline: na Goal status: INITIAL  3.  Patient to be able to sleep through the night  Baseline: na Goal status: INITIAL  4.  Patient to begin and maintain a consistent exercise regimen Baseline: na Goal status: INITIAL  5.  Cervical ROM to be [facility] Baseline: na Goal status: INITIAL  6.  UE and postural strength to be 4+/5 Baseline: na Goal status: INITIAL   PLAN: PT FREQUENCY: 1-2x/week  PT DURATION: 8 weeks  PLANNED INTERVENTIONS: Therapeutic exercises, Therapeutic activity, Neuromuscular re-education, Balance training, Patient/Family education, Self Care, Joint mobilization, Aquatic Therapy, Dry Needling, Electrical stimulation, Spinal mobilization, Cryotherapy, Moist heat, scar mobilization, Taping, Traction, Ultrasound, Ionotophoresis 42m/ml Dexamethasone, Manual therapy, and Re-evaluation  PLAN FOR NEXT SESSION: Manual/Dry needling as indicated, possible use of traction, flexibility, progress postural strength   SJuel Burrow PT 09/09/22 10:59 AM   [facility] [address] [place] [place] [address]Phone # [phone] [phone]

## 2022-09-12 ENCOUNTER — Ambulatory Visit: Payer: Medicare Other | Admitting: Rehabilitative and Restorative Service Providers"

## 2022-09-12 ENCOUNTER — Encounter: Payer: Self-pay | Admitting: Rehabilitative and Restorative Service Providers"

## 2022-09-12 DIAGNOSIS — R252 Cramp and spasm: Secondary | ICD-10-CM

## 2022-09-12 DIAGNOSIS — R293 Abnormal posture: Secondary | ICD-10-CM

## 2022-09-12 DIAGNOSIS — M542 Cervicalgia: Secondary | ICD-10-CM | POA: Diagnosis not present

## 2022-09-12 DIAGNOSIS — M6281 Muscle weakness (generalized): Secondary | ICD-10-CM | POA: Diagnosis not present

## 2022-09-12 NOTE — Therapy (Signed)
OUTPATIENT PHYSICAL THERAPY TREATMENT NOTE   Patient Name: Charlene Hunt MRN: [id number] DOB:Dec 06, 1950, 71 y.o., female Today's Date: 09/12/2022   PT End of Session - 09/12/22 1234     Visit Number 6    Date for PT Re-Evaluation 10/08/22    Authorization Type [employer]    Progress Note Due on Visit 10    PT Start Time 1230    PT Stop Time 1310    PT Time Calculation (min) 40 min    Activity Tolerance Patient tolerated treatment well    Behavior During Therapy WFL for tasks assessed/performed              Past Medical History:  Diagnosis Date   Abnormal finding on thyroid function test    Acid reflux    ADD (attention deficit disorder)    Allergies    Anxiety    Asymptomatic menopausal state    Current every day vaping    Depression    Diverticulosis    Elevated coronary artery calcium score    Endometrial disorder    Erythema nodosum    Family history of breast cancer    Flat feet    GERD (gastroesophageal reflux disease)    H/O menorrhagia    Herpesviral infection of urogenital system    Hypothyroidism    Insomnia    Memory impairment    Migraine    OSA (obstructive sleep apnea)    Osteopenia    Plantar fasciitis    Sleep disorder    Vitamin D deficiency    Past Surgical History:  Procedure Laterality Date   C SECTIONS     CATARACTS     DILATION AND CURETTAGE OF UTERUS     ENDOMETRIAL ABLATION     NASAL SEPTOPLASTY FOR DEVIATED SEPTUM     TONSILLECTOMY     TORN RETINA     [place]     Patient Active Problem List   Diagnosis Date Noted   Cervicalgia 08/13/2022    PCP: Glenis Smoker, MD  REFERRING PROVIDER: Inez Catalina, MD   REFERRING DIAG: M54.2 (ICD-10-CM) - Cervicalgia   THERAPY DIAG:  Cervicalgia  Cramp and spasm  Muscle weakness (generalized)  Abnormal posture  Rationale for Evaluation and Treatment Rehabilitation  ONSET DATE: 08/08/2022   SUBJECTIVE:                                                                                                                                                                                                          SUBJECTIVE STATEMENT:  Pt reports that she did not have the soreness after manual therapy that she had with dry needling.  Pt reports that she is 7% better than she was at initial evaluation.  Pt reports that pain since last visit has been no higher than a 4-5/10. "I didn't feel as tight this week"  PERTINENT HISTORY:  Sleep apnea, insomnia, excessive stress  PAIN:  Are you having pain? Yes: NPRS scale: 1-2/10 Pain location: left side of neck and upper trap Pain description: aching Aggravating factors: nothing in particular Relieving factors: Tylenol, heat  PRECAUTIONS: None  WEIGHT BEARING RESTRICTIONS No  FALLS:  Has patient fallen in last 6 months? Yes. Number of falls was carrying her grandson up some steps and he was writhing around.   OCCUPATION: retired Agricultural engineer  PLOF: Independent, Independent with basic ADLs, Independent with household mobility without device, Independent with community mobility without device, Independent with homemaking with ambulation, Independent with gait, and Independent with transfers  PATIENT GOALS   Less pain  OBJECTIVE:   DIAGNOSTIC FINDINGS:  na  PATIENT SURVEYS:  Eval:  FOTO 56 (goal is 27)   COGNITION: Overall cognitive status: Within functional limits for tasks assessed   SENSATION: WFL Patient reports history of some symptoms associated with carpal tunnel bilaterally and left thumb trigger finger,  also hx of mild left shoulder injury in yoga.   POSTURE: rounded shoulders and forward head slight kyphosis  PALPATION: Tender, tight bands, trigger points bilateral upper traps, parascapular areas Left > right    CERVICAL ROM:   Active ROM A/PROM (deg) eval A/ROM (Deg) 09/09/22  Flexion WNL 65  Extension 50 50  Right lateral flexion 32 35  Left  lateral flexion 30 35  Right rotation 42 55  Left rotation 45 55   (Blank rows = not tested)  UPPER EXTREMITY ROM:  WNL  UPPER EXTREMITY MMT:  Eval:  Left shoulder ER and scaption with IR both 3+/5,  all others generally 4+/5  CERVICAL SPECIAL TESTS:  Eval:  Spurling's test: Negative   FUNCTIONAL TESTS:  Eval:  5 times sit to stand : 14.06 sec  TODAY'S TREATMENT:  09/12/2022: UBE level 1.0 x3 min each direction with PT present to discuss status Seated upper trap and levator stretch 2x20 sec bilat Seated shoulder ER and horizontal abduction with red tband 2x10 Standing rows and shoulder extension: red 2x10 bil each Trigger Point Dry-Needling  Treatment instructions: Expect mild to moderate muscle soreness. S/S of pneumothorax if dry needled over a lung field, and to seek immediate medical attention should they occur. Patient verbalized understanding of these instructions and education. Patient Consent Given: Yes Education handout provided: Previously provided Muscles treated: Bilat throacic multifidi, bilat upper trap Electrical stimulation performed: No Parameters: N/A Treatment response/outcome: Utilized skilled palpation to identify trigger points.  Able to palpate muscle twitch and muscle elongation following dry needling.  Manual Therapy:  soft tissue mobilization to cervical and thoracic paraspinals, upper trap, and rhomboids.  Manual trigger point release in right cervical paraspinals   09/09/2022: UBE level 1.0 x3 min each direction with PT present to discuss status Seated upper trap and levator stretch 2x20 sec bilat Seated shoulder ER and horizontal abduction with red tband 2x10 Standing rows and shoulder extension: red 2x10 bil each Seated chin tucks 2x10 Manual Therapy:  soft tissue mobilization to cervical and thoracic paraspinals, upper trap, and rhomboids.  Manual trigger point release in upper traps.  Manual traction 3 x 5 sec with pt reporting  relief of  symptoms. Cervical rotation and extension.  X15 each Seated shoulder flexion with 1# 2x10   08/27/2022: UBE level 1.0 x3 min each direction with PT present to discuss status Seated cervical retraction and scapular retraction 2x10 each Cervical rotation and extension.  X15 each Seated upper trap and levator stretch 2x20 sec bilat Seated shoulder ER and horizontal abduction with red tband 2x10 Standing rows and shoulder extension: red 2x10 bil each Trigger Point Dry-Needling  Treatment instructions: Expect mild to moderate muscle soreness. S/S of pneumothorax if dry needled over a lung field, and to seek immediate medical attention should they occur. Patient verbalized understanding of these instructions and education. Patient Consent Given: Yes Education handout provided: Previously provided Muscles treated: Bilat suboccipitals, left cervical multifidi, bilat upper trap Electrical stimulation performed: No Parameters: N/A Treatment response/outcome: Utilized skilled palpation to identify trigger points.  Able to palpate muscle twitch and muscle elongation following dry needling.     PATIENT EDUCATION:  Education details: Initiated HEP, educated on dry needling and provided handout Person educated: Patient Education method: Consulting civil engineer, Media planner, Verbal cues, and Handouts Education comprehension: verbalized understanding, returned demonstration, and verbal cues required  HOME EXERCISE PROGRAM: Access Code: [email or web address] URL: [email or web address] Date: 08/15/2022 Prepared by: Shelby Dubin Corinna Burkman  Exercises - Seated Upper Trapezius Stretch  - 1 x daily - 7 x weekly - 1 sets - 2 reps - 20 sec hold - Seated Levator Scapulae Stretch  - 1 x daily - 7 x weekly - 1 sets - 2 reps - 20 sec hold - Seated Scapular Retraction  - 1 x daily - 7 x weekly - 3 sets - 10 reps - Shoulder Rolls in Sitting  - 1 x daily - 7 x weekly - 3 sets - 10 reps - Seated Cervical Retraction  - 1 x daily  - 7 x weekly - 3 sets - 10 reps - Seated Cervical Rotation AROM  - 1 x daily - 7 x weekly - 3 sets - 10 reps - Shoulder extension with resistance - Neutral  - 1 x daily - 7 x weekly - 3 sets - 10 reps - Standing Shoulder Row with Anchored Resistance  - 1 x daily - 7 x weekly - 3 sets - 10 reps - Seated Shoulder Horizontal Abduction with Resistance  - 1 x daily - 7 x weekly - 3 sets - 10 reps - Shoulder External Rotation and Scapular Retraction with Resistance  - 1 x daily - 7 x weekly - 2 sets - 10 reps  ASSESSMENT:  CLINICAL IMPRESSION: Ms Diven presents to skilled PT stating that she felt better after last PT session and her pain at worst has only been 4-5/10.  Pt states that she is still staying up later at night, but it is not due to pain.  She states that pain no longer is waking her up at night and she is sleeping at least 5 hours until she wakes up to have to empty her bladder.  Pt with improved technique noted with exercises and requires less cuing and reports compliance with HEP.  Pt reports feeling looser at completion of PT session.   OBJECTIVE IMPAIRMENTS decreased cognition, decreased ROM, decreased strength, increased fascial restrictions, increased muscle spasms, impaired flexibility, postural dysfunction, and pain.   ACTIVITY LIMITATIONS carrying, lifting, sleeping, transfers, bed mobility, dressing, reach over head, and hygiene/grooming  PARTICIPATION LIMITATIONS: meal prep, cleaning, laundry, driving, shopping, community activity, occupation, and yard work  PERSONAL FACTORS Behavior pattern, Fitness, Past/current  experiences, Time since onset of injury/illness/exacerbation, and 1-2 comorbidities: insomnia,  are also affecting patient's functional outcome.   REHAB POTENTIAL: Good  CLINICAL DECISION MAKING: Stable/uncomplicated  EVALUATION COMPLEXITY: Low   GOALS: Goals reviewed with patient? Yes  SHORT TERM GOALS: Target date: 09/10/2022   Patient will be  independent with initial HEP  Baseline: na Goal status: MET  2.  Pain report to be no greater than 4/10  Baseline: 6/10 (1/023/23) Goal status: in progress   3.  Patient to be able to fall asleep no later than 10pm and sleep consecutive 4 hours Baseline: na Goal status: MET on 09/12/2022 with pt reporting that she can sleep for 5 hours, pain no longer waking her up at night   LONG TERM GOALS: Target date: 10/08/2022  Patient to be independent with advanced HEP  Baseline: na Goal status: INITIAL  2.  Patient to report pain no greater than 2/10  Baseline: na Goal status: INITIAL  3.  Patient to be able to sleep through the night  Baseline: na Goal status: INITIAL  4.  Patient to begin and maintain a consistent exercise regimen Baseline: na Goal status: INITIAL  5.  Cervical ROM to be [facility] Baseline: na Goal status: INITIAL  6.  UE and postural strength to be 4+/5 Baseline: na Goal status: INITIAL   PLAN: PT FREQUENCY: 1-2x/week  PT DURATION: 8 weeks  PLANNED INTERVENTIONS: Therapeutic exercises, Therapeutic activity, Neuromuscular re-education, Balance training, Patient/Family education, Self Care, Joint mobilization, Aquatic Therapy, Dry Needling, Electrical stimulation, Spinal mobilization, Cryotherapy, Moist heat, scar mobilization, Taping, Traction, Ultrasound, Ionotophoresis 64m/ml Dexamethasone, Manual therapy, and Re-evaluation  PLAN FOR NEXT SESSION: Manual/Dry needling as indicated, possible use of traction, flexibility, progress postural strength   SJuel Burrow PT 09/12/22 1:21 PM   [facility] [address] SKingvaleGCleora [place] [address]Phone # [phone] [phone]

## 2022-09-16 ENCOUNTER — Ambulatory Visit: Payer: Medicare Other

## 2022-09-16 DIAGNOSIS — R293 Abnormal posture: Secondary | ICD-10-CM | POA: Diagnosis not present

## 2022-09-16 DIAGNOSIS — M6281 Muscle weakness (generalized): Secondary | ICD-10-CM | POA: Diagnosis not present

## 2022-09-16 DIAGNOSIS — M542 Cervicalgia: Secondary | ICD-10-CM

## 2022-09-16 DIAGNOSIS — R252 Cramp and spasm: Secondary | ICD-10-CM | POA: Diagnosis not present

## 2022-09-16 NOTE — Therapy (Signed)
OUTPATIENT PHYSICAL THERAPY TREATMENT NOTE   Patient Name: Charlene Hunt MRN: [id number] DOB:[birth year], 71 y.o., female Today's Date: 09/16/2022   PT End of Session - 09/16/22 1242     Visit Number 7    Date for PT Re-Evaluation 10/08/22    Authorization Type [employer]IC[employer]    PT Start Time 1235    PT Stop Time 1311    PT Time Calculation (min) 36 min    Activity Tolerance Patient tolerated treatment well    Behavior During Therapy WFL for tasks assessed/performed              Past Medical History:  Diagnosis Date   Abnormal finding on thyroid function test    Acid reflux    ADD (attention deficit disorder)    Allergies    Anxiety    Asymptomatic menopausal state    Current every day vaping    Depression    Diverticulosis    Elevated coronary artery calcium score    Endometrial disorder    Erythema nodosum    Family history of breast cancer    Flat feet    GERD (gastroesophageal reflux disease)    H/O menorrhagia    Herpesviral infection of urogenital system    Hypothyroidism    Insomnia    Memory impairment    Migraine    OSA (obstructive sleep apnea)    Osteopenia    Plantar fasciitis    Sleep disorder    Vitamin D deficiency    Past Surgical History:  Procedure Laterality Date   C SECTIONS     CATARACTS     DILATION AND CURETTAGE OF UTERUS     ENDOMETRIAL ABLATION     NASAL SEPTOPLASTY FOR DEVIATED SEPTUM     TONSILLECTOMY     TORN RETINA     [place]     Patient Active Problem List   Diagnosis Date Noted   Cervicalgia 08/13/2022    PCP: Glenis Smoker, MD  REFERRING PROVIDER: Inez Catalina, MD   REFERRING DIAG: M54.2 (ICD-10-CM) - Cervicalgia   THERAPY DIAG:  Cervicalgia  Cramp and spasm  Muscle weakness (generalized)  Abnormal posture  Rationale for Evaluation and Treatment Rehabilitation  ONSET DATE: 08/08/2022   SUBJECTIVE:                                                                                                                                                                                                          SUBJECTIVE STATEMENT:  Pt reports she feels some minor overall improvement but  still having the same pain as she did in the beginning.  The dry needling seems to help at times but other times, feels almost like the soreness makes me feel worse.    PERTINENT HISTORY:  Sleep apnea, insomnia, excessive stress  PAIN:  Are you having pain? Yes: NPRS scale: 1-2/10 Pain location: left side of neck and upper trap Pain description: aching Aggravating factors: nothing in particular Relieving factors: Tylenol, heat  PRECAUTIONS: None  WEIGHT BEARING RESTRICTIONS No  FALLS:  Has patient fallen in last 6 months? Yes. Number of falls was carrying her grandson up some steps and he was writhing around.   OCCUPATION: retired Agricultural engineer  PLOF: Independent, Independent with basic ADLs, Independent with household mobility without device, Independent with community mobility without device, Independent with homemaking with ambulation, Independent with gait, and Independent with transfers  PATIENT GOALS   Less pain  OBJECTIVE:   DIAGNOSTIC FINDINGS:  na  PATIENT SURVEYS:  Eval:  FOTO 56 (goal is 68)   COGNITION: Overall cognitive status: Within functional limits for tasks assessed   SENSATION: WFL Patient reports history of some symptoms associated with carpal tunnel bilaterally and left thumb trigger finger,  also hx of mild left shoulder injury in yoga.   POSTURE: rounded shoulders and forward head slight kyphosis  PALPATION: Tender, tight bands, trigger points bilateral upper traps, parascapular areas Left > right    CERVICAL ROM:   Active ROM A/PROM (deg) eval A/ROM (Deg) 09/09/22  Flexion WNL 65  Extension 50 50  Right lateral flexion 32 35  Left lateral flexion 30 35  Right rotation 42 55  Left rotation 45 55   (Blank rows = not  tested)  UPPER EXTREMITY ROM:  WNL  UPPER EXTREMITY MMT:  Eval:  Left shoulder ER and scaption with IR both 3+/5,  all others generally 4+/5  CERVICAL SPECIAL TESTS:  Eval:  Spurling's test: Negative   FUNCTIONAL TESTS:  Eval:  5 times sit to stand : 14.06 sec  TODAY'S TREATMENT:  09/12/2022: UBE unavailable Initiated shoulder stabilization exercises: Prone shoulder ext, rows and horizontal abd x 20 each on both shoulders with 0# SL ER x 20 on both shoulders with 0# Supine serratus punch x 20 bilateral  with 0# Manual PROM c spine, sub occipital release, upper trap and levator stretch, TP massage and soft tissue mobilization.    09/12/2022: UBE level 1.0 x3 min each direction with PT present to discuss status Seated upper trap and levator stretch 2x20 sec bilat Seated shoulder ER and horizontal abduction with red tband 2x10 Standing rows and shoulder extension: red 2x10 bil each Trigger Point Dry-Needling  Treatment instructions: Expect mild to moderate muscle soreness. S/S of pneumothorax if dry needled over a lung field, and to seek immediate medical attention should they occur. Patient verbalized understanding of these instructions and education. Patient Consent Given: Yes Education handout provided: Previously provided Muscles treated: Bilat throacic multifidi, bilat upper trap Electrical stimulation performed: No Parameters: N/A Treatment response/outcome: Utilized skilled palpation to identify trigger points.  Able to palpate muscle twitch and muscle elongation following dry needling.  Manual Therapy:  soft tissue mobilization to cervical and thoracic paraspinals, upper trap, and rhomboids.  Manual trigger point release in right cervical paraspinals   09/09/2022: UBE level 1.0 x3 min each direction with PT present to discuss status Seated upper trap and levator stretch 2x20 sec bilat Seated shoulder ER and horizontal abduction with red tband 2x10 Standing rows and  shoulder  extension: red 2x10 bil each Seated chin tucks 2x10 Manual Therapy:  soft tissue mobilization to cervical and thoracic paraspinals, upper trap, and rhomboids.  Manual trigger point release in upper traps.  Manual traction 3 x 5 sec with pt reporting relief of symptoms. Cervical rotation and extension.  X15 each Seated shoulder flexion with 1# 2x10   PATIENT EDUCATION:  Education details: Initiated HEP, educated on dry needling and provided handout Person educated: Patient Education method: Consulting civil engineer, Media planner, Verbal cues, and Handouts Education comprehension: verbalized understanding, returned demonstration, and verbal cues required  HOME EXERCISE PROGRAM: Access Code: [email or web address] URL: [email or web address] Date: 08/15/2022 Prepared by: Shelby Dubin Menke  Exercises - Seated Upper Trapezius Stretch  - 1 x daily - 7 x weekly - 1 sets - 2 reps - 20 sec hold - Seated Levator Scapulae Stretch  - 1 x daily - 7 x weekly - 1 sets - 2 reps - 20 sec hold - Seated Scapular Retraction  - 1 x daily - 7 x weekly - 3 sets - 10 reps - Shoulder Rolls in Sitting  - 1 x daily - 7 x weekly - 3 sets - 10 reps - Seated Cervical Retraction  - 1 x daily - 7 x weekly - 3 sets - 10 reps - Seated Cervical Rotation AROM  - 1 x daily - 7 x weekly - 3 sets - 10 reps - Shoulder extension with resistance - Neutral  - 1 x daily - 7 x weekly - 3 sets - 10 reps - Standing Shoulder Row with Anchored Resistance  - 1 x daily - 7 x weekly - 3 sets - 10 reps - Seated Shoulder Horizontal Abduction with Resistance  - 1 x daily - 7 x weekly - 3 sets - 10 reps - Shoulder External Rotation and Scapular Retraction with Resistance  - 1 x daily - 7 x weekly - 2 sets - 10 reps  ASSESSMENT:  CLINICAL IMPRESSION: Ms Kesling is progressing slowly.  She responds somewhat to DN and tband exercises.  We added isolated shoulder stabilization today.  She was able to complete all of these with no resistance with  ease.  She may be able to add resistance next visit.     OBJECTIVE IMPAIRMENTS decreased cognition, decreased ROM, decreased strength, increased fascial restrictions, increased muscle spasms, impaired flexibility, postural dysfunction, and pain.   ACTIVITY LIMITATIONS carrying, lifting, sleeping, transfers, bed mobility, dressing, reach over head, and hygiene/grooming  PARTICIPATION LIMITATIONS: meal prep, cleaning, laundry, driving, shopping, community activity, occupation, and yard work  PERSONAL FACTORS Behavior pattern, Fitness, Past/current experiences, Time since onset of injury/illness/exacerbation, and 1-2 comorbidities: insomnia,  are also affecting patient's functional outcome.   REHAB POTENTIAL: Good  CLINICAL DECISION MAKING: Stable/uncomplicated  EVALUATION COMPLEXITY: Low   GOALS: Goals reviewed with patient? Yes  SHORT TERM GOALS: Target date: 09/10/2022   Patient will be independent with initial HEP  Baseline: na Goal status: MET  2.  Pain report to be no greater than 4/10  Baseline: 6/10 (1/023/23) Goal status: in progress   3.  Patient to be able to fall asleep no later than 10pm and sleep consecutive 4 hours Baseline: na Goal status: MET on 09/12/2022 with pt reporting that she can sleep for 5 hours, pain no longer waking her up at night   LONG TERM GOALS: Target date: 10/08/2022  Patient to be independent with advanced HEP  Baseline: na Goal status: INITIAL  2.  Patient to report pain no greater than 2/10  Baseline: na Goal status: INITIAL  3.  Patient to be able to sleep through the night  Baseline: na Goal status: INITIAL  4.  Patient to begin and maintain a consistent exercise regimen Baseline: na Goal status: INITIAL  5.  Cervical ROM to be [facility] Baseline: na Goal status: INITIAL  6.  UE and postural strength to be 4+/5 Baseline: na Goal status: INITIAL   PLAN: PT FREQUENCY: 1-2x/week  PT DURATION: 8 weeks  PLANNED  INTERVENTIONS: Therapeutic exercises, Therapeutic activity, Neuromuscular re-education, Balance training, Patient/Family education, Self Care, Joint mobilization, Aquatic Therapy, Dry Needling, Electrical stimulation, Spinal mobilization, Cryotherapy, Moist heat, scar mobilization, Taping, Traction, Ultrasound, Ionotophoresis 19m/ml Dexamethasone, Manual therapy, and Re-evaluation  PLAN FOR NEXT SESSION: Manual/Dry needling as indicated, possible use of traction, flexibility, progress postural strength   Krystyna Cleckley B. Lenell Lama, PT 09/16/22 1:16 PM   [address] [place] [address]Phone # [phone] [phone]

## 2022-09-18 ENCOUNTER — Ambulatory Visit
Admission: RE | Admit: 2022-09-18 | Discharge: 2022-09-18 | Disposition: A | Discharge status: Home / Self Care | Payer: Medicare Other | Source: Ambulatory Visit | Attending: Family Medicine | Admitting: Family Medicine

## 2022-09-18 DIAGNOSIS — Z1231 Encounter for screening mammogram for malignant neoplasm of breast: Secondary | ICD-10-CM

## 2022-09-23 ENCOUNTER — Ambulatory Visit: Payer: Medicare Other | Admitting: Rehabilitative and Restorative Service Providers"

## 2022-09-23 ENCOUNTER — Encounter: Payer: Self-pay | Admitting: Rehabilitative and Restorative Service Providers"

## 2022-09-23 DIAGNOSIS — R252 Cramp and spasm: Secondary | ICD-10-CM | POA: Diagnosis not present

## 2022-09-23 DIAGNOSIS — R293 Abnormal posture: Secondary | ICD-10-CM

## 2022-09-23 DIAGNOSIS — M542 Cervicalgia: Secondary | ICD-10-CM

## 2022-09-23 DIAGNOSIS — M6281 Muscle weakness (generalized): Secondary | ICD-10-CM

## 2022-09-23 NOTE — Therapy (Signed)
OUTPATIENT PHYSICAL THERAPY TREATMENT NOTE   Patient Name: Charlene Hunt MRN: [id number] DOB:06-Mar-1951, 71 y.o., female Today's Date: 09/23/2022   PT End of Session - 09/23/22 1145     Visit Number 8    Date for PT Re-Evaluation 10/08/22    Authorization Type [employer]    Progress Note Due on Visit 10    PT Start Time 1143    PT Stop Time 1221    PT Time Calculation (min) 38 min    Activity Tolerance Patient tolerated treatment well    Behavior During Therapy WFL for tasks assessed/performed              Past Medical History:  Diagnosis Date   Abnormal finding on thyroid function test    Acid reflux    ADD (attention deficit disorder)    Allergies    Anxiety    Asymptomatic menopausal state    Current every day vaping    Depression    Diverticulosis    Elevated coronary artery calcium score    Endometrial disorder    Erythema nodosum    Family history of breast cancer    Flat feet    GERD (gastroesophageal reflux disease)    H/O menorrhagia    Herpesviral infection of urogenital system    Hypothyroidism    Insomnia    Memory impairment    Migraine    OSA (obstructive sleep apnea)    Osteopenia    Plantar fasciitis    Sleep disorder    Vitamin D deficiency    Past Surgical History:  Procedure Laterality Date   C SECTIONS     CATARACTS     DILATION AND CURETTAGE OF UTERUS     ENDOMETRIAL ABLATION     NASAL SEPTOPLASTY FOR DEVIATED SEPTUM     TONSILLECTOMY     TORN RETINA     [place]     Patient Active Problem List   Diagnosis Date Noted   Cervicalgia 08/13/2022    PCP: Glenis Smoker, MD  REFERRING PROVIDER: Inez Catalina, MD   REFERRING DIAG: M54.2 (ICD-10-CM) - Cervicalgia   THERAPY DIAG:  Cervicalgia  Cramp and spasm  Muscle weakness (generalized)  Abnormal posture  Rationale for Evaluation and Treatment Rehabilitation  ONSET DATE: 08/08/2022   SUBJECTIVE:                                                                                                                                                                                                          SUBJECTIVE STATEMENT:  Pt admits to not doing her exercises over the Thanksgiving holiday and is having increased pain today.  PERTINENT HISTORY:  Sleep apnea, insomnia, excessive stress  PAIN:  Are you having pain? Yes: NPRS scale: 7/10 Pain location: left side of neck and upper trap Pain description: aching Aggravating factors: nothing in particular Relieving factors: Tylenol, heat  PRECAUTIONS: None  WEIGHT BEARING RESTRICTIONS No  FALLS:  Has patient fallen in last 6 months? Yes. Number of falls was carrying her grandson up some steps and he was writhing around.   OCCUPATION: retired Agricultural engineer  PLOF: Independent, Independent with basic ADLs, Independent with household mobility without device, Independent with community mobility without device, Independent with homemaking with ambulation, Independent with gait, and Independent with transfers  PATIENT GOALS   Less pain  OBJECTIVE:   DIAGNOSTIC FINDINGS:  na  PATIENT SURVEYS:  Eval:  FOTO 56 (goal is 9)   COGNITION: Overall cognitive status: Within functional limits for tasks assessed   SENSATION: WFL Patient reports history of some symptoms associated with carpal tunnel bilaterally and left thumb trigger finger,  also hx of mild left shoulder injury in yoga.   POSTURE: rounded shoulders and forward head slight kyphosis  PALPATION: Tender, tight bands, trigger points bilateral upper traps, parascapular areas Left > right    CERVICAL ROM:   Active ROM A/PROM (deg) eval A/ROM (Deg) 09/09/22  Flexion WNL 65  Extension 50 50  Right lateral flexion 32 35  Left lateral flexion 30 35  Right rotation 42 55  Left rotation 45 55   (Blank rows = not tested)  UPPER EXTREMITY ROM:  WNL  UPPER EXTREMITY MMT:  Eval:  Left shoulder ER  and scaption with IR both 3+/5,  all others generally 4+/5  CERVICAL SPECIAL TESTS:  Eval:  Spurling's test: Negative   FUNCTIONAL TESTS:  Eval:  5 times sit to stand : 14.06 sec  TODAY'S TREATMENT:  09/23/2022: UBE level 1.0 x3 min each direction with PT present to discuss status Seated shoulder ER and horizontal abduction with red tband 2x10 Standing rows and shoulder extension: green tband 2x10 bil each Prone with 1#:  shoulder extension, rows, horizontal abduction.  X20 bilat Sidelying shoulder ER with 1# 2x10 bilat Supine serratus punch with 1# 2x10 bilat Manual Therapy:  soft tissue mobilization to cervical paraspinals, upper traps, and rhomboids.  Sub-occipital release and manual trigger point release.   09/12/2022: UBE unavailable Initiated shoulder stabilization exercises: Prone shoulder ext, rows and horizontal abd x 20 each on both shoulders with 0# SL ER x 20 on both shoulders with 0# Supine serratus punch x 20 bilateral  with 0# Manual PROM c spine, sub occipital release, upper trap and levator stretch, TP massage and soft tissue mobilization.    09/12/2022: UBE level 1.0 x3 min each direction with PT present to discuss status Seated upper trap and levator stretch 2x20 sec bilat Seated shoulder ER and horizontal abduction with red tband 2x10 Standing rows and shoulder extension: red 2x10 bil each Trigger Point Dry-Needling  Treatment instructions: Expect mild to moderate muscle soreness. S/S of pneumothorax if dry needled over a lung field, and to seek immediate medical attention should they occur. Patient verbalized understanding of these instructions and education. Patient Consent Given: Yes Education handout provided: Previously provided Muscles treated: Bilat throacic multifidi, bilat upper trap Electrical stimulation performed: No Parameters: N/A Treatment response/outcome: Utilized skilled palpation to identify trigger points.  Able to palpate muscle twitch  and muscle elongation following  dry needling.  Manual Therapy:  soft tissue mobilization to cervical and thoracic paraspinals, upper trap, and rhomboids.  Manual trigger point release in right cervical paraspinals    PATIENT EDUCATION:  Education details: Initiated HEP, educated on dry needling and provided handout Person educated: Patient Education method: Consulting civil engineer, Demonstration, Verbal cues, and Handouts Education comprehension: verbalized understanding, returned demonstration, and verbal cues required  HOME EXERCISE PROGRAM: Access Code: [email or web address] URL: [email or web address] Date: 08/15/2022 Prepared by: Shelby Dubin Seleste Tallman  Exercises - Seated Upper Trapezius Stretch  - 1 x daily - 7 x weekly - 1 sets - 2 reps - 20 sec hold - Seated Levator Scapulae Stretch  - 1 x daily - 7 x weekly - 1 sets - 2 reps - 20 sec hold - Seated Scapular Retraction  - 1 x daily - 7 x weekly - 3 sets - 10 reps - Shoulder Rolls in Sitting  - 1 x daily - 7 x weekly - 3 sets - 10 reps - Seated Cervical Retraction  - 1 x daily - 7 x weekly - 3 sets - 10 reps - Seated Cervical Rotation AROM  - 1 x daily - 7 x weekly - 3 sets - 10 reps - Shoulder extension with resistance - Neutral  - 1 x daily - 7 x weekly - 3 sets - 10 reps - Standing Shoulder Row with Anchored Resistance  - 1 x daily - 7 x weekly - 3 sets - 10 reps - Seated Shoulder Horizontal Abduction with Resistance  - 1 x daily - 7 x weekly - 3 sets - 10 reps - Shoulder External Rotation and Scapular Retraction with Resistance  - 1 x daily - 7 x weekly - 2 sets - 10 reps  ASSESSMENT:  CLINICAL IMPRESSION: Ms Willner is progressing towards goal related activities.  Pt states that she is able to take Tylenol and sleep through the night without waking up due to pain.  Patient states that when she is feeling sore, she will do her neck exercises.  Pt with decreased pain following session and reporting 5/10 pain.  Pt able to progress with prone  exercises to using 1# weight.  Pt continues to require skilled PT to progress towards goal related activities.   OBJECTIVE IMPAIRMENTS decreased cognition, decreased ROM, decreased strength, increased fascial restrictions, increased muscle spasms, impaired flexibility, postural dysfunction, and pain.   ACTIVITY LIMITATIONS carrying, lifting, sleeping, transfers, bed mobility, dressing, reach over head, and hygiene/grooming  PARTICIPATION LIMITATIONS: meal prep, cleaning, laundry, driving, shopping, community activity, occupation, and yard work  PERSONAL FACTORS Behavior pattern, Fitness, Past/current experiences, Time since onset of injury/illness/exacerbation, and 1-2 comorbidities: insomnia,  are also affecting patient's functional outcome.   REHAB POTENTIAL: Good  CLINICAL DECISION MAKING: Stable/uncomplicated  EVALUATION COMPLEXITY: Low   GOALS: Goals reviewed with patient? Yes  SHORT TERM GOALS: Target date: 09/10/2022   Patient will be independent with initial HEP  Baseline: na Goal status: MET  2.  Pain report to be no greater than 4/10  Baseline: 6/10 (1/023/23) Goal status: in progress   3.  Patient to be able to fall asleep no later than 10pm and sleep consecutive 4 hours Baseline: na Goal status: MET on 09/12/2022 with pt reporting that she can sleep for 5 hours, pain no longer waking her up at night   LONG TERM GOALS: Target date: 10/08/2022  Patient to be independent with advanced HEP  Baseline: na Goal status: IN PROGRESS  2.  Patient to report pain no  greater than 2/10  Baseline: na Goal status: INITIAL  3.  Patient to be able to sleep through the night  Baseline: na Goal status: IN PROGRESS  4.  Patient to begin and maintain a consistent exercise regimen Baseline: na Goal status: INITIAL  5.  Cervical ROM to be [facility] Baseline: na Goal status: IN PROGRESS  6.  UE and postural strength to be 4+/5 Baseline: na Goal status: INITIAL   PLAN: PT  FREQUENCY: 1-2x/week  PT DURATION: 8 weeks  PLANNED INTERVENTIONS: Therapeutic exercises, Therapeutic activity, Neuromuscular re-education, Balance training, Patient/Family education, Self Care, Joint mobilization, Aquatic Therapy, Dry Needling, Electrical stimulation, Spinal mobilization, Cryotherapy, Moist heat, scar mobilization, Taping, Traction, Ultrasound, Ionotophoresis 53m/ml Dexamethasone, Manual therapy, and Re-evaluation  PLAN FOR NEXT SESSION: Manual/Dry needling as indicated, possible use of traction, flexibility, progress postural strength   SJuel Burrow PT 09/23/22 12:25 PM   [facility] [address] SSeville[place]phraim Sanctuary [address]Phone # [phone] [phone]

## 2022-09-26 ENCOUNTER — Encounter: Payer: Self-pay | Admitting: Rehabilitative and Restorative Service Providers"

## 2022-09-26 ENCOUNTER — Ambulatory Visit: Payer: Medicare Other | Admitting: Rehabilitative and Restorative Service Providers"

## 2022-09-26 DIAGNOSIS — R252 Cramp and spasm: Secondary | ICD-10-CM | POA: Diagnosis not present

## 2022-09-26 DIAGNOSIS — M542 Cervicalgia: Secondary | ICD-10-CM | POA: Diagnosis not present

## 2022-09-26 DIAGNOSIS — M6281 Muscle weakness (generalized): Secondary | ICD-10-CM

## 2022-09-26 DIAGNOSIS — R293 Abnormal posture: Secondary | ICD-10-CM | POA: Diagnosis not present

## 2022-09-26 NOTE — Therapy (Signed)
OUTPATIENT PHYSICAL THERAPY TREATMENT NOTE   Patient Name: Charlene Hunt MRN: [id number] DOB:June 14, 1951, 71 y.o., female Today's Date: 09/26/2022   PT End of Session - 09/26/22 1233     Visit Number 9    Date for PT Re-Evaluation 10/08/22    Authorization Type [employer]    Progress Note Due on Visit 10    PT Start Time 1230    PT Stop Time 1310    PT Time Calculation (min) 40 min    Activity Tolerance Patient tolerated treatment well    Behavior During Therapy WFL for tasks assessed/performed              Past Medical History:  Diagnosis Date   Abnormal finding on thyroid function test    Acid reflux    ADD (attention deficit disorder)    Allergies    Anxiety    Asymptomatic menopausal state    Current every day vaping    Depression    Diverticulosis    Elevated coronary artery calcium score    Endometrial disorder    Erythema nodosum    Family history of breast cancer    Flat feet    GERD (gastroesophageal reflux disease)    H/O menorrhagia    Herpesviral infection of urogenital system    Hypothyroidism    Insomnia    Memory impairment    Migraine    OSA (obstructive sleep apnea)    Osteopenia    Plantar fasciitis    Sleep disorder    Vitamin D deficiency    Past Surgical History:  Procedure Laterality Date   C SECTIONS     CATARACTS     DILATION AND CURETTAGE OF UTERUS     ENDOMETRIAL ABLATION     NASAL SEPTOPLASTY FOR DEVIATED SEPTUM     TONSILLECTOMY     TORN RETINA     [place]     Patient Active Problem List   Diagnosis Date Noted   Cervicalgia 08/13/2022    PCP: Glenis Smoker, MD  REFERRING PROVIDER: Inez Catalina, MD   REFERRING DIAG: M54.2 (ICD-10-CM) - Cervicalgia   THERAPY DIAG:  Cervicalgia  Cramp and spasm  Muscle weakness (generalized)  Abnormal posture  Rationale for Evaluation and Treatment Rehabilitation  ONSET DATE: 08/08/2022   SUBJECTIVE:                                                                                                                                                                                                          SUBJECTIVE STATEMENT:  Pt reports decreased pain of 3-4/10 yesterday, but states that today it is back up to a 7/10.  PERTINENT HISTORY:  Sleep apnea, insomnia, excessive stress  PAIN:  Are you having pain? Yes: NPRS scale: 7/10 Pain location: left side of neck and upper trap Pain description: aching Aggravating factors: nothing in particular Relieving factors: Tylenol, heat  PRECAUTIONS: None  WEIGHT BEARING RESTRICTIONS No  FALLS:  Has patient fallen in last 6 months? Yes. Number of falls was carrying her grandson up some steps and he was writhing around.   OCCUPATION: retired Agricultural engineer  PLOF: Independent, Independent with basic ADLs, Independent with household mobility without device, Independent with community mobility without device, Independent with homemaking with ambulation, Independent with gait, and Independent with transfers  PATIENT GOALS   Less pain  OBJECTIVE:   DIAGNOSTIC FINDINGS:  na  PATIENT SURVEYS:  Eval:  FOTO 56 (goal is 32)   COGNITION: Overall cognitive status: Within functional limits for tasks assessed   SENSATION: WFL Patient reports history of some symptoms associated with carpal tunnel bilaterally and left thumb trigger finger,  also hx of mild left shoulder injury in yoga.   POSTURE: rounded shoulders and forward head slight kyphosis  PALPATION: Tender, tight bands, trigger points bilateral upper traps, parascapular areas Left > right    CERVICAL ROM:   Active ROM A/PROM (deg) eval A/ROM (Deg) 09/09/22  Flexion WNL 65  Extension 50 50  Right lateral flexion 32 35  Left lateral flexion 30 35  Right rotation 42 55  Left rotation 45 55   (Blank rows = not tested)  UPPER EXTREMITY ROM:  WNL  UPPER EXTREMITY MMT:  Eval:  Left shoulder ER and  scaption with IR both 3+/5,  all others generally 4+/5  CERVICAL SPECIAL TESTS:  Eval:  Spurling's test: Negative   FUNCTIONAL TESTS:  Eval:  5 times sit to stand : 14.06 sec  TODAY'S TREATMENT:   09/26/2022: UBE level 1.0 x3 min each direction with PT present to discuss status Seated shoulder ER and horizontal abduction with red tband 2x10 Standing rows and shoulder extension: green tband 2x10 bil each Prone with 1#:  shoulder extension, rows, horizontal abduction.  X20 bilat Supine serratus punch with 1# 2x10 bilat Manual Therapy:  soft tissue mobilization to cervical paraspinals, upper traps, and rhomboids.  Sub-occipital release and manual trigger point release   09/23/2022: UBE level 1.0 x3 min each direction with PT present to discuss status Seated shoulder ER and horizontal abduction with red tband 2x10 Standing rows and shoulder extension: green tband 2x10 bil each Prone with 1#:  shoulder extension, rows, horizontal abduction.  X20 bilat Sidelying shoulder ER with 1# 2x10 bilat Supine serratus punch with 1# 2x10 bilat Manual Therapy:  soft tissue mobilization to cervical paraspinals, upper traps, and rhomboids.  Sub-occipital release and manual trigger point release.   09/12/2022: UBE unavailable Initiated shoulder stabilization exercises: Prone shoulder ext, rows and horizontal abd x 20 each on both shoulders with 0# SL ER x 20 on both shoulders with 0# Supine serratus punch x 20 bilateral  with 0# Manual PROM c spine, sub occipital release, upper trap and levator stretch, TP massage and soft tissue mobilization.      PATIENT EDUCATION:  Education details: Initiated HEP, educated on dry needling and provided handout Person educated: Patient Education method: Consulting civil engineer, Media planner, Verbal cues, and Handouts Education comprehension: verbalized understanding, returned demonstration, and verbal cues required  HOME EXERCISE PROGRAM: Access Code: [email or web address] URL:  [email or web address] Date: 08/15/2022 Prepared by: Shelby Dubin Ronel Rodeheaver  Exercises - Seated Upper Trapezius Stretch  - 1 x daily - 7 x weekly - 1 sets - 2 reps - 20 sec hold - Seated Levator Scapulae Stretch  - 1 x daily - 7 x weekly - 1 sets - 2 reps - 20 sec hold - Seated Scapular Retraction  - 1 x daily - 7 x weekly - 3 sets - 10 reps - Shoulder Rolls in Sitting  - 1 x daily - 7 x weekly - 3 sets - 10 reps - Seated Cervical Retraction  - 1 x daily - 7 x weekly - 3 sets - 10 reps - Seated Cervical Rotation AROM  - 1 x daily - 7 x weekly - 3 sets - 10 reps - Shoulder extension with resistance - Neutral  - 1 x daily - 7 x weekly - 3 sets - 10 reps - Standing Shoulder Row with Anchored Resistance  - 1 x daily - 7 x weekly - 3 sets - 10 reps - Seated Shoulder Horizontal Abduction with Resistance  - 1 x daily - 7 x weekly - 3 sets - 10 reps - Shoulder External Rotation and Scapular Retraction with Resistance  - 1 x daily - 7 x weekly - 2 sets - 10 reps  ASSESSMENT:  CLINICAL IMPRESSION: Ms Gover is progressing towards having some times with decreased pain, but was having increased pain this morning.  Pt continues to have a great response to manual therapy with decreased pain reported following.  Patient requires less cuing for therex and has improved technique and posture noted. Pt continues to have trigger points in cervical region and tolerates manual trigger point release well and with decreased symptoms noted following.   OBJECTIVE IMPAIRMENTS decreased cognition, decreased ROM, decreased strength, increased fascial restrictions, increased muscle spasms, impaired flexibility, postural dysfunction, and pain.   ACTIVITY LIMITATIONS carrying, lifting, sleeping, transfers, bed mobility, dressing, reach over head, and hygiene/grooming  PARTICIPATION LIMITATIONS: meal prep, cleaning, laundry, driving, shopping, community activity, occupation, and yard work  PERSONAL FACTORS Behavior  pattern, Fitness, Past/current experiences, Time since onset of injury/illness/exacerbation, and 1-2 comorbidities: insomnia,  are also affecting patient's functional outcome.   REHAB POTENTIAL: Good  CLINICAL DECISION MAKING: Stable/uncomplicated  EVALUATION COMPLEXITY: Low   GOALS: Goals reviewed with patient? Yes  SHORT TERM GOALS: Target date: 09/10/2022   Patient will be independent with initial HEP  Baseline: na Goal status: MET  2.  Pain report to be no greater than 4/10  Baseline: 6/10 (1/023/23) Goal status: in progress   3.  Patient to be able to fall asleep no later than 10pm and sleep consecutive 4 hours Baseline: na Goal status: MET on 09/12/2022 with pt reporting that she can sleep for 5 hours, pain no longer waking her up at night   LONG TERM GOALS: Target date: 10/08/2022  Patient to be independent with advanced HEP  Baseline: na Goal status: IN PROGRESS  2.  Patient to report pain no greater than 2/10  Baseline: na Goal status: INITIAL  3.  Patient to be able to sleep through the night  Baseline: na Goal status: IN PROGRESS  4.  Patient to begin and maintain a consistent exercise regimen Baseline: na Goal status: INITIAL  5.  Cervical ROM to be [facility] Baseline: na Goal status: IN PROGRESS  6.  UE and postural strength to be 4+/5 Baseline: na Goal status: INITIAL   PLAN: PT FREQUENCY: 1-2x/week  PT DURATION:  8 weeks  PLANNED INTERVENTIONS: Therapeutic exercises, Therapeutic activity, Neuromuscular re-education, Balance training, Patient/Family education, Self Care, Joint mobilization, Aquatic Therapy, Dry Needling, Electrical stimulation, Spinal mobilization, Cryotherapy, Moist heat, scar mobilization, Taping, Traction, Ultrasound, Ionotophoresis 20m/ml Dexamethasone, Manual therapy, and Re-evaluation  PLAN FOR NEXT SESSION: Manual/Dry needling as indicated, possible use of traction, flexibility, progress postural strength   SJuel Burrow  PT 09/26/22 1:30 PM   [facility] [address] S[place] HavenGLa Liga [place] [address]Phone # [phone] [phone]

## 2022-09-27 ENCOUNTER — Ambulatory Visit: Payer: Medicare Other | Admitting: Rehabilitative and Restorative Service Providers"

## 2022-09-30 ENCOUNTER — Encounter: Payer: Self-pay | Admitting: Rehabilitative and Restorative Service Providers"

## 2022-09-30 ENCOUNTER — Ambulatory Visit: Payer: Medicare Other | Attending: Sports Medicine | Admitting: Rehabilitative and Restorative Service Providers"

## 2022-09-30 DIAGNOSIS — M542 Cervicalgia: Secondary | ICD-10-CM

## 2022-09-30 DIAGNOSIS — R252 Cramp and spasm: Secondary | ICD-10-CM

## 2022-09-30 DIAGNOSIS — R293 Abnormal posture: Secondary | ICD-10-CM

## 2022-09-30 DIAGNOSIS — M6281 Muscle weakness (generalized): Secondary | ICD-10-CM | POA: Diagnosis not present

## 2022-09-30 NOTE — Therapy (Signed)
OUTPATIENT PHYSICAL THERAPY TREATMENT NOTE   Patient Name: Charlene Hunt MRN: [id number] DOB:02-18-1951, 71 y.o., female Today's Date: 09/30/2022   Progress Note Reporting Period 08/13/2022 to 09/30/2022  See note below for Objective Data and Assessment of Progress/Goals.        PT End of Session - 09/30/22 1148     Visit Number 10    Date for PT Re-Evaluation 10/08/22    Authorization Type [employer]    Progress Note Due on Visit 89    PT Start Time 1145    PT Stop Time 1225    PT Time Calculation (min) 40 min    Activity Tolerance Patient tolerated treatment well    Behavior During Therapy WFL for tasks assessed/performed              Past Medical History:  Diagnosis Date   Abnormal finding on thyroid function test    Acid reflux    ADD (attention deficit disorder)    Allergies    Anxiety    Asymptomatic menopausal state    Current every day vaping    Depression    Diverticulosis    Elevated coronary artery calcium score    Endometrial disorder    Erythema nodosum    Family history of breast cancer    Flat feet    GERD (gastroesophageal reflux disease)    H/O menorrhagia    Herpesviral infection of urogenital system    Hypothyroidism    Insomnia    Memory impairment    Migraine    OSA (obstructive sleep apnea)    Osteopenia    Plantar fasciitis    Sleep disorder    Vitamin D deficiency    Past Surgical History:  Procedure Laterality Date   C SECTIONS     CATARACTS     DILATION AND CURETTAGE OF UTERUS     ENDOMETRIAL ABLATION     NASAL SEPTOPLASTY FOR DEVIATED SEPTUM     TONSILLECTOMY     TORN RETINA     [place]     Patient Active Problem List   Diagnosis Date Noted   Cervicalgia 08/13/2022    PCP: Glenis Smoker, MD  REFERRING PROVIDER: Inez Catalina, MD   REFERRING DIAG: M54.2 (ICD-10-CM) - Cervicalgia   THERAPY DIAG:  Cervicalgia  Cramp and spasm  Muscle weakness  (generalized)  Abnormal posture  Rationale for Evaluation and Treatment Rehabilitation  ONSET DATE: 08/08/2022   SUBJECTIVE:  SUBJECTIVE STATEMENT:  Pt reports overall making at least 70% improvements since initial evaluation.  States that she is having some pain today, but had a really busy weekend.  "One thing that is really good is I am more conscious of my posture."  PERTINENT HISTORY:  Sleep apnea, insomnia, excessive stress  PAIN:  Are you having pain? Yes: NPRS scale: 6-7/10 Pain location: left side of neck and upper trap Pain description: aching Aggravating factors: nothing in particular Relieving factors: Tylenol, heat  PRECAUTIONS: None  WEIGHT BEARING RESTRICTIONS No  FALLS:  Has patient fallen in last 6 months? Yes. Number of falls was carrying her grandson up some steps and he was writhing around.   OCCUPATION: retired Agricultural engineer  PLOF: Independent, Independent with basic ADLs, Independent with household mobility without device, Independent with community mobility without device, Independent with homemaking with ambulation, Independent with gait, and Independent with transfers  PATIENT GOALS   Less pain  OBJECTIVE:   DIAGNOSTIC FINDINGS:  na  PATIENT SURVEYS:  Eval:  FOTO 56 (goal is 60) 09/30/2022:  FOTO 61%   COGNITION: Overall cognitive status: Within functional limits for tasks assessed   SENSATION: WFL Patient reports history of some symptoms associated with carpal tunnel bilaterally and left thumb trigger finger,  also hx of mild left shoulder injury in yoga.   POSTURE: rounded shoulders and forward head slight kyphosis  PALPATION: Tender, tight bands, trigger points bilateral upper traps, parascapular areas Left > right    CERVICAL ROM:    Active ROM A/PROM (deg) eval A/ROM (Deg) 09/09/22  Flexion WNL 65  Extension 50 50  Right lateral flexion 32 35  Left lateral flexion 30 35  Right rotation 42 55  Left rotation 45 55   (Blank rows = not tested)  UPPER EXTREMITY ROM:  WNL  UPPER EXTREMITY MMT:  Eval:  Left shoulder ER and scaption with IR both 3+/5,  all others generally 4+/5  CERVICAL SPECIAL TESTS:  Eval:  Spurling's test: Negative   FUNCTIONAL TESTS:  Eval:  5 times sit to stand : 14.06 sec  TODAY'S TREATMENT:   09/30/2022: UBE level 1.0 x3 min each direction with PT present to discuss status Seated shoulder ER and horizontal abduction with green tband 2x10 Seated SNAGS for cervical rotation 2x10 bilat Standing rows and shoulder extension: green tband 2x10 bil each Prone with 2#:  shoulder extension, rows, horizontal abduction.  X20 bilat Sidelying ER with 2# 2x10 bilat Manual Therapy:  soft tissue mobilization to cervical paraspinals, upper traps, and rhomboids.  Sub-occipital release and manual trigger point release   09/26/2022: UBE level 1.0 x3 min each direction with PT present to discuss status Seated shoulder ER and horizontal abduction with red tband 2x10 Standing rows and shoulder extension: green tband 2x10 bil each Prone with 1#:  shoulder extension, rows, horizontal abduction.  X20 bilat Supine serratus punch with 1# 2x10 bilat Manual Therapy:  soft tissue mobilization to cervical paraspinals, upper traps, and rhomboids.  Sub-occipital release and manual trigger point release   09/23/2022: UBE level 1.0 x3 min each direction with PT present to discuss status Seated shoulder ER and horizontal abduction with red tband 2x10 Standing rows and shoulder extension: green tband 2x10 bil each Prone with 1#:  shoulder extension, rows, horizontal abduction.  X20 bilat Sidelying shoulder ER with 1# 2x10 bilat Supine serratus punch with 1# 2x10 bilat Manual Therapy:  soft tissue mobilization  to cervical paraspinals, upper traps, and rhomboids.  Sub-occipital release and  manual trigger point release.    PATIENT EDUCATION:  Education details: Initiated HEP, educated on dry needling and provided handout Person educated: Patient Education method: Consulting civil engineer, Media planner, Verbal cues, and Handouts Education comprehension: verbalized understanding, returned demonstration, and verbal cues required  HOME EXERCISE PROGRAM: Access Code: [email or web address] URL: [email or web address] Date: 09/30/2022 Prepared by: Shelby Dubin Demoni Parmar  Exercises - Seated Upper Trapezius Stretch  - 1 x daily - 7 x weekly - 1 sets - 2 reps - 20 sec hold - Seated Levator Scapulae Stretch  - 1 x daily - 7 x weekly - 1 sets - 2 reps - 20 sec hold - Seated Scapular Retraction  - 1 x daily - 7 x weekly - 3 sets - 10 reps - Shoulder Rolls in Sitting  - 1 x daily - 7 x weekly - 3 sets - 10 reps - Seated Cervical Retraction  - 1 x daily - 7 x weekly - 3 sets - 10 reps - Seated Assisted Cervical Rotation with Towel  - 1 x daily - 7 x weekly - 2 sets - 10 reps - Shoulder extension with resistance - Neutral  - 1 x daily - 7 x weekly - 3 sets - 10 reps - Standing Shoulder Row with Anchored Resistance  - 1 x daily - 7 x weekly - 3 sets - 10 reps - Seated Shoulder Horizontal Abduction with Resistance  - 1 x daily - 7 x weekly - 3 sets - 10 reps - Shoulder External Rotation and Scapular Retraction with Resistance  - 1 x daily - 7 x weekly - 2 sets - 10 reps - Prone Shoulder Extension - Single Arm  - 2 x daily - 7 x weekly - 2 sets - 10 reps - Prone Shoulder Row  - 2 x daily - 7 x weekly - 2 sets - 10 reps - Prone Single Arm Shoulder Horizontal Abduction with Scapular Retraction and Palm Down  - 2 x daily - 7 x weekly - 2 sets - 10 reps - Sidelying Shoulder External Rotation  - 2 x daily - 7 x weekly - 2 sets - 10 reps - Single Arm Serratus Punches in Supine with Dumbbell  - 2 x daily - 7 x weekly - 2 sets - 10  reps  ASSESSMENT:  CLINICAL IMPRESSION: Charlene Hunt is progressing and reports at least 70% improvement since initial evaluation and has met FOTO goal.  Patient able to progress to using 2# weights with only minimal muscle fatigue noted.  Patient reported that the SNAG exercise helped her neck feel more supported and assisted with A/ROM.  Patient states that she is able to sleep through the night without waking up due to pain now, but does take an Ibuprofen before bed.  Patient continues to require skilled PT to progress towards goal related activities.   OBJECTIVE IMPAIRMENTS decreased cognition, decreased ROM, decreased strength, increased fascial restrictions, increased muscle spasms, impaired flexibility, postural dysfunction, and pain.   ACTIVITY LIMITATIONS carrying, lifting, sleeping, transfers, bed mobility, dressing, reach over head, and hygiene/grooming  PARTICIPATION LIMITATIONS: meal prep, cleaning, laundry, driving, shopping, community activity, occupation, and yard work  PERSONAL FACTORS Behavior pattern, Fitness, Past/current experiences, Time since onset of injury/illness/exacerbation, and 1-2 comorbidities: insomnia,  are also affecting patient's functional outcome.   REHAB POTENTIAL: Good  CLINICAL DECISION MAKING: Stable/uncomplicated  EVALUATION COMPLEXITY: Low   GOALS: Goals reviewed with patient? Yes  SHORT TERM GOALS: Target date: 09/10/2022   Patient will be independent with initial HEP  Baseline: na Goal status: MET  2.  Pain report to be no greater than 4/10  Baseline: 6/10 (1/023/23) Goal status: in progress   3.  Patient to be able to fall asleep no later than 10pm and sleep consecutive 4 hours Baseline: na Goal status: MET on 09/12/2022 with pt reporting that she can sleep for 5 hours, pain no longer waking her up at night   LONG TERM GOALS: Target date: 10/08/2022  Patient to be independent with advanced HEP  Baseline: na Goal status: IN  PROGRESS  2.  Patient to report pain no greater than 2/10  Baseline: na Goal status: INITIAL  3.  Patient to be able to sleep through the night  Baseline: na Goal status: MET on 09/30/2022  4.  Patient to begin and maintain a consistent exercise regimen Baseline: na Goal status: IN PROGRESS  5.  Cervical ROM to be [facility] Baseline: na Goal status: IN PROGRESS  6.  UE and postural strength to be 4+/5 Baseline: na Goal status: INITIAL   PLAN: PT FREQUENCY: 1-2x/week  PT DURATION: 8 weeks  PLANNED INTERVENTIONS: Therapeutic exercises, Therapeutic activity, Neuromuscular re-education, Balance training, Patient/Family education, Self Care, Joint mobilization, Aquatic Therapy, Dry Needling, Electrical stimulation, Spinal mobilization, Cryotherapy, Moist heat, scar mobilization, Taping, Traction, Ultrasound, Ionotophoresis 58m/ml Dexamethasone, Manual therapy, and Re-evaluation  PLAN FOR NEXT SESSION: Manual/Dry needling as indicated, possible use of traction, flexibility, progress postural strength   SJuel Burrow PT 09/30/22 12:33 PM   [facility] [address] [place] [address]Phone # [phone] [phone]

## 2022-10-03 ENCOUNTER — Ambulatory Visit: Payer: Medicare Other

## 2022-10-03 DIAGNOSIS — R252 Cramp and spasm: Secondary | ICD-10-CM | POA: Diagnosis not present

## 2022-10-03 DIAGNOSIS — M6281 Muscle weakness (generalized): Secondary | ICD-10-CM | POA: Diagnosis not present

## 2022-10-03 DIAGNOSIS — R293 Abnormal posture: Secondary | ICD-10-CM

## 2022-10-03 DIAGNOSIS — M542 Cervicalgia: Secondary | ICD-10-CM

## 2022-10-03 NOTE — Therapy (Signed)
OUTPATIENT PHYSICAL THERAPY TREATMENT NOTE   Patient Name: Charlene Hunt MRN: [id number] DOB:1951-05-12, 71 y.o., female Today's Date: 10/04/2022     PT End of Session - 10/03/22 1156     Visit Number 11    Date for PT Re-Evaluation 10/08/22    Authorization Type [employer]    Progress Note Due on Visit 36    PT Start Time 1145    PT Stop Time 1230    PT Time Calculation (min) 45 min    Activity Tolerance Patient tolerated treatment well    Behavior During Therapy WFL for tasks assessed/performed              Past Medical History:  Diagnosis Date   Abnormal finding on thyroid function test    Acid reflux    ADD (attention deficit disorder)    Allergies    Anxiety    Asymptomatic menopausal state    Current every day vaping    Depression    Diverticulosis    Elevated coronary artery calcium score    Endometrial disorder    Erythema nodosum    Family history of breast cancer    Flat feet    GERD (gastroesophageal reflux disease)    H/O menorrhagia    Herpesviral infection of urogenital system    Hypothyroidism    Insomnia    Memory impairment    Migraine    OSA (obstructive sleep apnea)    Osteopenia    Plantar fasciitis    Sleep disorder    Vitamin D deficiency    Past Surgical History:  Procedure Laterality Date   C SECTIONS     CATARACTS     DILATION AND CURETTAGE OF UTERUS     ENDOMETRIAL ABLATION     NASAL SEPTOPLASTY FOR DEVIATED SEPTUM     TONSILLECTOMY     TORN RETINA     [place]     Patient Active Problem List   Diagnosis Date Noted   Cervicalgia 08/13/2022    PCP: Glenis Smoker, MD  REFERRING PROVIDER: Inez Catalina, MD   REFERRING DIAG: M54.2 (ICD-10-CM) - Cervicalgia   THERAPY DIAG:  Cervicalgia  Cramp and spasm  Muscle weakness (generalized)  Abnormal posture  Rationale for Evaluation and Treatment Rehabilitation  ONSET DATE: 08/08/2022   SUBJECTIVE:                                                                                                                                                                                                          SUBJECTIVE  STATEMENT:  Pt reports she has experienced increased pain and tension in her neck and shoulders since yesterday.  PERTINENT HISTORY:  Sleep apnea, insomnia, excessive stress  PAIN:  Are you having pain? Yes: NPRS scale: 6-7/10 Pain location: left side of neck and upper trap Pain description: aching Aggravating factors: nothing in particular Relieving factors: Tylenol, heat  PRECAUTIONS: None  WEIGHT BEARING RESTRICTIONS No  FALLS:  Has patient fallen in last 6 months? Yes. Number of falls was carrying her grandson up some steps and he was writhing around.   OCCUPATION: retired Agricultural engineer  PLOF: Independent, Independent with basic ADLs, Independent with household mobility without device, Independent with community mobility without device, Independent with homemaking with ambulation, Independent with gait, and Independent with transfers  PATIENT GOALS   Less pain  OBJECTIVE:   DIAGNOSTIC FINDINGS:  na  PATIENT SURVEYS:  Eval:  FOTO 56 (goal is 60) 09/30/2022:  FOTO 61%   COGNITION: Overall cognitive status: Within functional limits for tasks assessed   SENSATION: WFL Patient reports history of some symptoms associated with carpal tunnel bilaterally and left thumb trigger finger,  also hx of mild left shoulder injury in yoga.   POSTURE: rounded shoulders and forward head slight kyphosis  PALPATION: Tender, tight bands, trigger points bilateral upper traps, parascapular areas Left > right    CERVICAL ROM:   Active ROM A/PROM (deg) eval A/ROM (Deg) 09/09/22  Flexion WNL 65  Extension 50 50  Right lateral flexion 32 35  Left lateral flexion 30 35  Right rotation 42 55  Left rotation 45 55   (Blank rows = not tested)  UPPER EXTREMITY ROM:  WNL  UPPER EXTREMITY  MMT:  Eval:  Left shoulder ER and scaption with IR both 3+/5,  all others generally 4+/5  CERVICAL SPECIAL TESTS:  Eval:  Spurling's test: Negative   FUNCTIONAL TESTS:  Eval:  5 times sit to stand : 14.06 sec  TODAY'S TREATMENT:   10/03/2022: UBE level 1.0 x3 min each direction with PT present to discuss status Standing rows and shoulder extension: green tband 2x10 bil each Standing bilateral shoulder ER and horizontal abduction with red band 2 x 10 Manual Therapy:  soft tissue mobilization to cervical paraspinals, upper traps, and rhomboids.  Sub-occipital release and manual trigger point release Trigger Point Dry-Needling  Treatment instructions: Expect mild to moderate muscle soreness. S/S of pneumothorax if dry needled over a lung field, and to seek immediate medical attention should they occur. Patient verbalized understanding of these instructions and education.  Patient Consent Given: Yes Education handout provided: Yes Muscles treated: thoracic multifidi, rhomboids,subscapularis, suboccipitals, bilateral upper traps Electrical stimulation performed: No Parameters: N/A Treatment response/outcome: PT used manual palpation to identify trigger points and tight bands in the about muscle groups, dry needling administered to these areas, twitch response noted in bilateral upper traps along with resulting muscle elongation.  Patient reported relief of pain and minimal soreness post treatment.    09/30/2022: UBE level 1.0 x3 min each direction with PT present to discuss status Seated shoulder ER and horizontal abduction with green tband 2x10 Seated SNAGS for cervical rotation 2x10 bilat Standing rows and shoulder extension: green tband 2x10 bil each Prone with 2#:  shoulder extension, rows, horizontal abduction.  X20 bilat Sidelying ER with 2# 2x10 bilat Manual Therapy:  soft tissue mobilization to cervical paraspinals, upper traps, and rhomboids.  Sub-occipital release and manual  trigger point release   09/26/2022: UBE level 1.0 x3 min each direction  with PT present to discuss status Seated shoulder ER and horizontal abduction with red tband 2x10 Standing rows and shoulder extension: green tband 2x10 bil each Prone with 1#:  shoulder extension, rows, horizontal abduction.  X20 bilat Supine serratus punch with 1# 2x10 bilat Manual Therapy:  soft tissue mobilization to cervical paraspinals, upper traps, and rhomboids.  Sub-occipital release and manual trigger point release  PATIENT EDUCATION:  Education details: Initiated HEP, educated on dry needling and provided handout Person educated: Patient Education method: Consulting civil engineer, Demonstration, Verbal cues, and Handouts Education comprehension: verbalized understanding, returned demonstration, and verbal cues required  HOME EXERCISE PROGRAM: Access Code: [email or web address] URL: [email or web address] Date: 09/30/2022 Prepared by: Shelby Dubin Menke  Exercises - Seated Upper Trapezius Stretch  - 1 x daily - 7 x weekly - 1 sets - 2 reps - 20 sec hold - Seated Levator Scapulae Stretch  - 1 x daily - 7 x weekly - 1 sets - 2 reps - 20 sec hold - Seated Scapular Retraction  - 1 x daily - 7 x weekly - 3 sets - 10 reps - Shoulder Rolls in Sitting  - 1 x daily - 7 x weekly - 3 sets - 10 reps - Seated Cervical Retraction  - 1 x daily - 7 x weekly - 3 sets - 10 reps - Seated Assisted Cervical Rotation with Towel  - 1 x daily - 7 x weekly - 2 sets - 10 reps - Shoulder extension with resistance - Neutral  - 1 x daily - 7 x weekly - 3 sets - 10 reps - Standing Shoulder Row with Anchored Resistance  - 1 x daily - 7 x weekly - 3 sets - 10 reps - Seated Shoulder Horizontal Abduction with Resistance  - 1 x daily - 7 x weekly - 3 sets - 10 reps - Shoulder External Rotation and Scapular Retraction with Resistance  - 1 x daily - 7 x weekly - 2 sets - 10 reps - Prone Shoulder Extension - Single Arm  - 2 x daily - 7 x weekly - 2 sets -  10 reps - Prone Shoulder Row  - 2 x daily - 7 x weekly - 2 sets - 10 reps - Prone Single Arm Shoulder Horizontal Abduction with Scapular Retraction and Palm Down  - 2 x daily - 7 x weekly - 2 sets - 10 reps - Sidelying Shoulder External Rotation  - 2 x daily - 7 x weekly - 2 sets - 10 reps - Single Arm Serratus Punches in Supine with Dumbbell  - 2 x daily - 7 x weekly - 2 sets - 10 reps  ASSESSMENT:  CLINICAL IMPRESSION: Ms Christner had initially felt that the dry needling was not helping but due to her increased pain, she wanted to try again today.  She is doing well with her HEP and is consistent with these.  She likely had the exacerbation from making jewelry and being in a head down position for a long period of time.  She reported significant decrease in pain following today's session.  She was encouraged to take frequent rest breaks when doing head down activities.   Patient continues to require skilled PT to progress towards goal related activities.   OBJECTIVE IMPAIRMENTS decreased cognition, decreased ROM, decreased strength, increased fascial restrictions, increased muscle spasms, impaired flexibility, postural dysfunction, and pain.   ACTIVITY LIMITATIONS carrying, lifting, sleeping, transfers, bed mobility, dressing, reach over head, and hygiene/grooming  PARTICIPATION LIMITATIONS: meal prep, cleaning, laundry, driving, shopping,  community activity, occupation, and yard work  PERSONAL FACTORS Behavior pattern, Fitness, Past/current experiences, Time since onset of injury/illness/exacerbation, and 1-2 comorbidities: insomnia,  are also affecting patient's functional outcome.   REHAB POTENTIAL: Good  CLINICAL DECISION MAKING: Stable/uncomplicated  EVALUATION COMPLEXITY: Low   GOALS: Goals reviewed with patient? Yes  SHORT TERM GOALS: Target date: 09/10/2022   Patient will be independent with initial HEP  Baseline: na Goal status: MET  2.  Pain report to be no greater than  4/10  Baseline: 6/10 (1/023/23) Goal status: in progress   3.  Patient to be able to fall asleep no later than 10pm and sleep consecutive 4 hours Baseline: na Goal status: MET on 09/12/2022 with pt reporting that she can sleep for 5 hours, pain no longer waking her up at night   LONG TERM GOALS: Target date: 10/08/2022  Patient to be independent with advanced HEP  Baseline: na Goal status: IN PROGRESS  2.  Patient to report pain no greater than 2/10  Baseline: na Goal status: INITIAL  3.  Patient to be able to sleep through the night  Baseline: na Goal status: MET on 09/30/2022  4.  Patient to begin and maintain a consistent exercise regimen Baseline: na Goal status: IN PROGRESS  5.  Cervical ROM to be [facility] Baseline: na Goal status: IN PROGRESS  6.  UE and postural strength to be 4+/5 Baseline: na Goal status: INITIAL   PLAN: PT FREQUENCY: 1-2x/week  PT DURATION: 8 weeks  PLANNED INTERVENTIONS: Therapeutic exercises, Therapeutic activity, Neuromuscular re-education, Balance training, Patient/Family education, Self Care, Joint mobilization, Aquatic Therapy, Dry Needling, Electrical stimulation, Spinal mobilization, Cryotherapy, Moist heat, scar mobilization, Taping, Traction, Ultrasound, Ionotophoresis 50m/ml Dexamethasone, Manual therapy, and Re-evaluation  PLAN FOR NEXT SESSION: Manual/Dry needling as indicated, possible use of traction, flexibility, progress postural strength   Janan Bogie B. Duffy Dantonio, PT 10/04/22 7:10 AM   [facility] [address] [place] [address]Phone # [phone] [phone]

## 2022-10-07 ENCOUNTER — Encounter: Payer: Self-pay | Admitting: Rehabilitative and Restorative Service Providers"

## 2022-10-07 ENCOUNTER — Ambulatory Visit: Payer: Medicare Other | Admitting: Rehabilitative and Restorative Service Providers"

## 2022-10-07 DIAGNOSIS — R252 Cramp and spasm: Secondary | ICD-10-CM

## 2022-10-07 DIAGNOSIS — R293 Abnormal posture: Secondary | ICD-10-CM

## 2022-10-07 DIAGNOSIS — M6281 Muscle weakness (generalized): Secondary | ICD-10-CM | POA: Diagnosis not present

## 2022-10-07 DIAGNOSIS — M542 Cervicalgia: Secondary | ICD-10-CM | POA: Diagnosis not present

## 2022-10-07 NOTE — Therapy (Signed)
OUTPATIENT PHYSICAL THERAPY TREATMENT NOTE AND REASSESSMENT   Patient Name: Charlene Hunt MRN: [id number] DOB:Apr 09, 1951, 71 y.o., female Today's Date: 10/07/2022     PT End of Session - 10/07/22 1151     Visit Number 12    Date for PT Re-Evaluation 11/29/22    Authorization Type [employer]    Progress Note Due on Visit 20    PT Start Time 1145    PT Stop Time 1225    PT Time Calculation (min) 40 min    Activity Tolerance Patient tolerated treatment well    Behavior During Therapy WFL for tasks assessed/performed              Past Medical History:  Diagnosis Date   Abnormal finding on thyroid function test    Acid reflux    ADD (attention deficit disorder)    Allergies    Anxiety    Asymptomatic menopausal state    Current every day vaping    Depression    Diverticulosis    Elevated coronary artery calcium score    Endometrial disorder    Erythema nodosum    Family history of breast cancer    Flat feet    GERD (gastroesophageal reflux disease)    H/O menorrhagia    Herpesviral infection of urogenital system    Hypothyroidism    Insomnia    Memory impairment    Migraine    OSA (obstructive sleep apnea)    Osteopenia    Plantar fasciitis    Sleep disorder    Vitamin D deficiency    Past Surgical History:  Procedure Laterality Date   C SECTIONS     CATARACTS     DILATION AND CURETTAGE OF UTERUS     ENDOMETRIAL ABLATION     NASAL SEPTOPLASTY FOR DEVIATED SEPTUM     TONSILLECTOMY     TORN RETINA     [place]     Patient Active Problem List   Diagnosis Date Noted   Cervicalgia 08/13/2022    PCP: Glenis Smoker, MD  REFERRING PROVIDER: Inez Catalina, MD   REFERRING DIAG: M54.2 (ICD-10-CM) - Cervicalgia   THERAPY DIAG:  Cervicalgia - Plan: PT plan of care cert/re-cert  Cramp and spasm - Plan: PT plan of care cert/re-cert  Muscle weakness (generalized) - Plan: PT plan of care cert/re-cert  Abnormal  posture - Plan: PT plan of care cert/re-cert  Rationale for Evaluation and Treatment Rehabilitation  ONSET DATE: 08/08/2022   SUBJECTIVE:  SUBJECTIVE STATEMENT:  Pt reports that DN helped her feel better.  States that she is doing better, but still wants to continue PT.  Pt reports that she is able to look behind her to back up her car easier than she could before.  Pt reports that today she is feeling 60-70% better.  PERTINENT HISTORY:  Sleep apnea, insomnia, excessive stress  PAIN:  Are you having pain? Yes: NPRS scale: 4-5/10 Pain location: left side of neck and upper trap Pain description: aching Aggravating factors: nothing in particular Relieving factors: Tylenol, heat  PRECAUTIONS: None  WEIGHT BEARING RESTRICTIONS No  FALLS:  Has patient fallen in last 6 months? Yes. Number of falls was carrying her grandson up some steps and he was writhing around.   OCCUPATION: retired Agricultural engineer  PLOF: Independent, Independent with basic ADLs, Independent with household mobility without device, Independent with community mobility without device, Independent with homemaking with ambulation, Independent with gait, and Independent with transfers  PATIENT GOALS   Less pain  OBJECTIVE:   DIAGNOSTIC FINDINGS:  na  PATIENT SURVEYS:  Eval:  FOTO 56 (goal is 60) 09/30/2022:  FOTO 61%   COGNITION: Overall cognitive status: Within functional limits for tasks assessed   SENSATION: WFL Patient reports history of some symptoms associated with carpal tunnel bilaterally and left thumb trigger finger,  also hx of mild left shoulder injury in yoga.   POSTURE: rounded shoulders and forward head slight kyphosis  PALPATION: Tender, tight bands, trigger points bilateral upper traps,  parascapular areas Left > right    CERVICAL ROM:   Active ROM A/PROM (deg) eval A/ROM (Deg) 09/09/22 A/ROM (Deg) 10/07/22  Flexion WNL 65 65  Extension 50 50 55  Right lateral flexion 32 35 40  Left lateral flexion 30 35 38  Right rotation 42 55 60  Left rotation 45 55 60   (Blank rows = not tested)  UPPER EXTREMITY ROM:  WNL  UPPER EXTREMITY MMT:  Eval:  Left shoulder ER and scaption with IR both 3+/5,  all others generally 4+/5  CERVICAL SPECIAL TESTS:  Eval:  Spurling's test: Negative   FUNCTIONAL TESTS:  Eval:  5 times sit to stand : 14.06 sec  10/07/2022: 5 times sit to stand:  8.24 sec  TODAY'S TREATMENT:   10/07/2022: UBE level 1.0 x3 min each direction with PT present to discuss status Seated SNAGS for cervical rotation 2x10 bilat Seated shoulder ER and horizontal abduction with red tband 2x10 Standing rows and shoulder extension: green tband 2x10 bil each Standing lat pull with 25# 2x10 Counter stretch at barre 2x20 sec Barre push-ups 2x10 Prone with 2#:  shoulder extension, rows, horizontal abduction.  X20 bilat Sidelying ER with 2# 2x10 bilat   10/03/2022: UBE level 1.0 x3 min each direction with PT present to discuss status Standing rows and shoulder extension: green tband 2x10 bil each Standing bilateral shoulder ER and horizontal abduction with red band 2 x 10 Manual Therapy:  soft tissue mobilization to cervical paraspinals, upper traps, and rhomboids.  Sub-occipital release and manual trigger point release Trigger Point Dry-Needling  Treatment instructions: Expect mild to moderate muscle soreness. S/S of pneumothorax if dry needled over a lung field, and to seek immediate medical attention should they occur. Patient verbalized understanding of these instructions and education.  Patient Consent Given: Yes Education handout provided: Yes Muscles treated: thoracic multifidi, rhomboids,subscapularis, suboccipitals, bilateral upper  traps Electrical stimulation performed: No Parameters: N/A Treatment response/outcome: PT used manual palpation to identify  trigger points and tight bands in the about muscle groups, dry needling administered to these areas, twitch response noted in bilateral upper traps along with resulting muscle elongation.  Patient reported relief of pain and minimal soreness post treatment.    09/30/2022: UBE level 1.0 x3 min each direction with PT present to discuss status Seated shoulder ER and horizontal abduction with green tband 2x10 Seated SNAGS for cervical rotation 2x10 bilat Standing rows and shoulder extension: green tband 2x10 bil each Prone with 2#:  shoulder extension, rows, horizontal abduction.  X20 bilat Sidelying ER with 2# 2x10 bilat Manual Therapy:  soft tissue mobilization to cervical paraspinals, upper traps, and rhomboids.  Sub-occipital release and manual trigger point release    PATIENT EDUCATION:  Education details: Initiated HEP, educated on dry needling and provided handout Person educated: Patient Education method: Consulting civil engineer, Demonstration, Verbal cues, and Handouts Education comprehension: verbalized understanding, returned demonstration, and verbal cues required  HOME EXERCISE PROGRAM: Access Code: [email or web address] URL: [email or web address] Date: 09/30/2022 Prepared by: Shelby Dubin Donnelle Rubey  Exercises - Seated Upper Trapezius Stretch  - 1 x daily - 7 x weekly - 1 sets - 2 reps - 20 sec hold - Seated Levator Scapulae Stretch  - 1 x daily - 7 x weekly - 1 sets - 2 reps - 20 sec hold - Seated Scapular Retraction  - 1 x daily - 7 x weekly - 3 sets - 10 reps - Shoulder Rolls in Sitting  - 1 x daily - 7 x weekly - 3 sets - 10 reps - Seated Cervical Retraction  - 1 x daily - 7 x weekly - 3 sets - 10 reps - Seated Assisted Cervical Rotation with Towel  - 1 x daily - 7 x weekly - 2 sets - 10 reps - Shoulder extension with resistance - Neutral  - 1 x daily - 7 x weekly - 3  sets - 10 reps - Standing Shoulder Row with Anchored Resistance  - 1 x daily - 7 x weekly - 3 sets - 10 reps - Seated Shoulder Horizontal Abduction with Resistance  - 1 x daily - 7 x weekly - 3 sets - 10 reps - Shoulder External Rotation and Scapular Retraction with Resistance  - 1 x daily - 7 x weekly - 2 sets - 10 reps - Prone Shoulder Extension - Single Arm  - 2 x daily - 7 x weekly - 2 sets - 10 reps - Prone Shoulder Row  - 2 x daily - 7 x weekly - 2 sets - 10 reps - Prone Single Arm Shoulder Horizontal Abduction with Scapular Retraction and Palm Down  - 2 x daily - 7 x weekly - 2 sets - 10 reps - Sidelying Shoulder External Rotation  - 2 x daily - 7 x weekly - 2 sets - 10 reps - Single Arm Serratus Punches in Supine with Dumbbell  - 2 x daily - 7 x weekly - 2 sets - 10 reps  ASSESSMENT:  CLINICAL IMPRESSION: Ms Weinfeld reported that dry needling last visit seemed to help, but she does not feel that she is ready for discharge yet.  Patient is making progress towards goals.  Patient has improved her 5 times sit to/from stand and improved cervical A/ROM.  Pt reports that she is able to drive with less difficulty.  Patient was able to initiate use of weight machines for lat pull down today.  Patient declined dry needling today stating that she is feeling better than she was  last session.  Patient would benefit from an additional 2x/week for 8 weeks to continue to progress with decreased pain with functional activities.   OBJECTIVE IMPAIRMENTS decreased cognition, decreased ROM, decreased strength, increased fascial restrictions, increased muscle spasms, impaired flexibility, postural dysfunction, and pain.   ACTIVITY LIMITATIONS carrying, lifting, sleeping, transfers, bed mobility, dressing, reach over head, and hygiene/grooming  PARTICIPATION LIMITATIONS: meal prep, cleaning, laundry, driving, shopping, community activity, occupation, and yard work  PERSONAL FACTORS Behavior pattern, Fitness,  Past/current experiences, Time since onset of injury/illness/exacerbation, and 1-2 comorbidities: insomnia,  are also affecting patient's functional outcome.   REHAB POTENTIAL: Good  CLINICAL DECISION MAKING: Stable/uncomplicated  EVALUATION COMPLEXITY: Low   GOALS: Goals reviewed with patient? Yes  SHORT TERM GOALS: Target date: 09/10/2022   Patient will be independent with initial HEP  Baseline: na Goal status: MET  2.  Pain report to be no greater than 4/10  Baseline: 6/10 (1/023/23) Goal status: in progress   3.  Patient to be able to fall asleep no later than 10pm and sleep consecutive 4 hours Baseline: na Goal status: MET on 09/12/2022 with pt reporting that she can sleep for 5 hours, pain no longer waking her up at night   LONG TERM GOALS: Target date: 11/29/2022  Patient to be independent with advanced HEP  Baseline: na Goal status: IN PROGRESS  2.  Patient to report pain no greater than 2/10  Baseline: na Goal status: INITIAL  3.  Patient to be able to sleep through the night  Baseline: na Goal status: MET on 09/30/2022  4.  Patient to begin and maintain a consistent exercise regimen Baseline: na Goal status: IN PROGRESS  5.  Cervical ROM to be [facility] Baseline: na Goal status: MET on 10/07/2022  6.  UE and postural strength to be 4+/5 Baseline: na Goal status: IN PROGRESS   PLAN: PT FREQUENCY: 1-2x/week  PT DURATION: 8 weeks  PLANNED INTERVENTIONS: Therapeutic exercises, Therapeutic activity, Neuromuscular re-education, Balance training, Patient/Family education, Self Care, Joint mobilization, Aquatic Therapy, Dry Needling, Electrical stimulation, Spinal mobilization, Cryotherapy, Moist heat, scar mobilization, Taping, Traction, Ultrasound, Ionotophoresis 70m/ml Dexamethasone, Manual therapy, and Re-evaluation  PLAN FOR NEXT SESSION: Manual/Dry needling as indicated, possible use of traction, flexibility, progress postural strength   SJuel Burrow  PT 10/07/22 12:41 PM   [facility] [address] SMahinahina[place]ood [place] [address]Phone # [phone] [phone]

## 2022-10-08 ENCOUNTER — Ambulatory Visit: Payer: Medicare Other | Attending: Cardiovascular Disease

## 2022-10-08 DIAGNOSIS — E785 Hyperlipidemia, unspecified: Secondary | ICD-10-CM | POA: Diagnosis not present

## 2022-10-08 DIAGNOSIS — Z79899 Other long term (current) drug therapy: Secondary | ICD-10-CM | POA: Diagnosis not present

## 2022-10-09 LAB — LIPOPROTEIN A (LPA): Lipoprotein (a): 33.5 nmol/L (ref <75.0)

## 2022-10-09 LAB — HEMOGLOBIN A1C
Est. average glucose Bld gHb Est-mCnc: 114 mg/dL
Hgb A1c MFr Bld: 5.6 % (ref 4.8–5.6)

## 2022-10-09 LAB — NMR, LIPOPROFILE
Cholesterol, Total: 156 mg/dL (ref 100–199)
HDL Particle Number: 46.6 umol/L (ref >=30.5)
HDL-C: 80 mg/dL (ref >39)
LDL Particle Number: 596 nmol/L (ref <1000)
LDL Size: 20.7 nm (ref >20.5)
LDL-C (NIH Calc): 60 mg/dL (ref 0–99)
LP-IR Score: 32 (ref <=45)
Small LDL Particle Number: 303 nmol/L (ref <=527)
Triglycerides: 89 mg/dL (ref 0–149)

## 2022-10-09 LAB — APOLIPOPROTEIN B: Apolipoprotein B: 59 mg/dL (ref <90)

## 2022-10-16 ENCOUNTER — Ambulatory Visit: Payer: Medicare Other

## 2022-10-16 DIAGNOSIS — R293 Abnormal posture: Secondary | ICD-10-CM | POA: Diagnosis not present

## 2022-10-16 DIAGNOSIS — R252 Cramp and spasm: Secondary | ICD-10-CM | POA: Diagnosis not present

## 2022-10-16 DIAGNOSIS — M542 Cervicalgia: Secondary | ICD-10-CM | POA: Diagnosis not present

## 2022-10-16 DIAGNOSIS — M6281 Muscle weakness (generalized): Secondary | ICD-10-CM | POA: Diagnosis not present

## 2022-10-16 NOTE — Therapy (Signed)
OUTPATIENT PHYSICAL THERAPY TREATMENT NOTE AND REASSESSMENT   Patient Name: Charlene Hunt MRN: [id number] DOB:1951/05/30, 71 y.o., female Today's Date: 10/16/2022     PT End of Session - 10/16/22 1238     Visit Number 13    Date for PT Re-Evaluation 11/29/22    Authorization Type [employer]ICARE    Progress Note Due on Visit 84    PT Start Time 1238    PT Stop Time 1312    PT Time Calculation (min) 34 min    Activity Tolerance Patient tolerated treatment well    Behavior During Therapy WFL for tasks assessed/performed              Past Medical History:  Diagnosis Date   Abnormal finding on thyroid function test    Acid reflux    ADD (attention deficit disorder)    Allergies    Anxiety    Asymptomatic menopausal state    Current every day vaping    Depression    Diverticulosis    Elevated coronary artery calcium score    Endometrial disorder    Erythema nodosum    Family history of breast cancer    Flat feet    GERD (gastroesophageal reflux disease)    H/O menorrhagia    Herpesviral infection of urogenital system    Hypothyroidism    Insomnia    Memory impairment    Migraine    OSA (obstructive sleep apnea)    Osteopenia    Plantar fasciitis    Sleep disorder    Vitamin D deficiency    Past Surgical History:  Procedure Laterality Date   C SECTIONS     CATARACTS     DILATION AND CURETTAGE OF UTERUS     ENDOMETRIAL ABLATION     NASAL SEPTOPLASTY FOR DEVIATED SEPTUM     TONSILLECTOMY     TORN RETINA     [place]     Patient Active Problem List   Diagnosis Date Noted   Cervicalgia 08/13/2022    PCP: Glenis Smoker, MD  REFERRING PROVIDER: Inez Catalina, MD   REFERRING DIAG: M54.2 (ICD-10-CM) - Cervicalgia   THERAPY DIAG:  Cervicalgia  Cramp and spasm  Muscle weakness (generalized)  Abnormal posture  Rationale for Evaluation and Treatment Rehabilitation  ONSET DATE: 08/08/2022   SUBJECTIVE:  SUBJECTIVE STATEMENT:  Pt reports that DN helped her feel better.  States that she is doing better, but still wants to continue PT.  Pt reports that she is able to look behind her to back up her car easier than she could before.  Pt reports that today she is feeling 60-70% better.  PERTINENT HISTORY:  Sleep apnea, insomnia, excessive stress  PAIN:  Are you having pain? Yes: NPRS scale: 4-5/10 Pain location: left side of neck and upper trap Pain description: aching Aggravating factors: nothing in particular Relieving factors: Tylenol, heat  PRECAUTIONS: None  WEIGHT BEARING RESTRICTIONS No  FALLS:  Has patient fallen in last 6 months? Yes. Number of falls was carrying her grandson up some steps and he was writhing around.   OCCUPATION: retired Agricultural engineer  PLOF: Independent, Independent with basic ADLs, Independent with household mobility without device, Independent with community mobility without device, Independent with homemaking with ambulation, Independent with gait, and Independent with transfers  PATIENT GOALS   Less pain  OBJECTIVE:   DIAGNOSTIC FINDINGS:  na  PATIENT SURVEYS:  Eval:  FOTO 56 (goal is 60) 09/30/2022:  FOTO 61%   COGNITION: Overall cognitive status: Within functional limits for tasks assessed   SENSATION: WFL Patient reports history of some symptoms associated with carpal tunnel bilaterally and left thumb trigger finger,  also hx of mild left shoulder injury in yoga.   POSTURE: rounded shoulders and forward head slight kyphosis  PALPATION: Tender, tight bands, trigger points bilateral upper traps, parascapular areas Left > right    CERVICAL ROM:   Active ROM A/PROM (deg) eval A/ROM (Deg) 09/09/22 A/ROM (Deg) 10/07/22  Flexion WNL 65 65   Extension 50 50 55  Right lateral flexion 32 35 40  Left lateral flexion 30 35 38  Right rotation 42 55 60  Left rotation 45 55 60   (Blank rows = not tested)  UPPER EXTREMITY ROM:  WNL  UPPER EXTREMITY MMT:  Eval:  Left shoulder ER and scaption with IR both 3+/5,  all others generally 4+/5  CERVICAL SPECIAL TESTS:  Eval:  Spurling's test: Negative   FUNCTIONAL TESTS:  Eval:  5 times sit to stand : 14.06 sec  10/07/2022: 5 times sit to stand:  8.24 sec  TODAY'S TREATMENT:  10/16/2022: UBE level 1.0 x3 min each direction with PT present to discuss status Standing rows and shoulder extension: green tband 2x10 bil each Standing bilateral shoulder ER and horizontal abduction with red band 2 x 10 Manual Therapy:  soft tissue mobilization to cervical paraspinals, upper traps, and rhomboids.  Sub-occipital release and manual trigger point release Trigger Point Dry-Needling  Treatment instructions: Expect mild to moderate muscle soreness. S/S of pneumothorax if dry needled over a lung field, and to seek immediate medical attention should they occur. Patient verbalized understanding of these instructions and education.  Patient Consent Given: Yes Education handout provided: Yes Muscles treated: thoracic multifidi, rhomboids,subscapularis, suboccipitals, bilateral upper traps Electrical stimulation performed: No Parameters: N/A Treatment response/outcome: PT used manual palpation to identify trigger points and tight bands in the about muscle groups, dry needling administered to these areas, twitch response noted in bilateral upper traps along with resulting muscle elongation.  Patient reported relief of pain and minimal soreness post treatment.    10/07/2022: UBE level 1.0 x3 min each direction with PT present to discuss status Seated SNAGS for cervical rotation 2x10 bilat Seated shoulder ER and horizontal abduction with red tband 2x10 Standing rows and shoulder extension:  green  tband 2x10 bil each Standing lat pull with 25# 2x10 Counter stretch at barre 2x20 sec Barre push-ups 2x10 Prone with 2#:  shoulder extension, rows, horizontal abduction.  X20 bilat Sidelying ER with 2# 2x10 bilat   10/03/2022: UBE level 1.0 x3 min each direction with PT present to discuss status Standing rows and shoulder extension: green tband 2x10 bil each Standing bilateral shoulder ER and horizontal abduction with red band 2 x 10 Manual Therapy:  soft tissue mobilization to cervical paraspinals, upper traps, and rhomboids.  Sub-occipital release and manual trigger point release Trigger Point Dry-Needling  Treatment instructions: Expect mild to moderate muscle soreness. S/S of pneumothorax if dry needled over a lung field, and to seek immediate medical attention should they occur. Patient verbalized understanding of these instructions and education.  Patient Consent Given: Yes Education handout provided: Yes Muscles treated: thoracic multifidi, rhomboids,subscapularis, suboccipitals, bilateral upper traps Electrical stimulation performed: No Parameters: N/A Treatment response/outcome: PT used manual palpation to identify trigger points and tight bands in the about muscle groups, dry needling administered to these areas, twitch response noted in bilateral upper traps along with resulting muscle elongation.  Patient reported relief of pain and minimal soreness post treatment.      PATIENT EDUCATION:  Education details: Initiated HEP, educated on dry needling and provided handout Person educated: Patient Education method: Consulting civil engineer, Media planner, Verbal cues, and Handouts Education comprehension: verbalized understanding, returned demonstration, and verbal cues required  HOME EXERCISE PROGRAM: Access Code: [email or web address] URL: [email or web address] Date: 09/30/2022 Prepared by: Shelby Dubin Menke  Exercises - Seated Upper Trapezius Stretch  - 1 x daily - 7 x weekly - 1 sets  - 2 reps - 20 sec hold - Seated Levator Scapulae Stretch  - 1 x daily - 7 x weekly - 1 sets - 2 reps - 20 sec hold - Seated Scapular Retraction  - 1 x daily - 7 x weekly - 3 sets - 10 reps - Shoulder Rolls in Sitting  - 1 x daily - 7 x weekly - 3 sets - 10 reps - Seated Cervical Retraction  - 1 x daily - 7 x weekly - 3 sets - 10 reps - Seated Assisted Cervical Rotation with Towel  - 1 x daily - 7 x weekly - 2 sets - 10 reps - Shoulder extension with resistance - Neutral  - 1 x daily - 7 x weekly - 3 sets - 10 reps - Standing Shoulder Row with Anchored Resistance  - 1 x daily - 7 x weekly - 3 sets - 10 reps - Seated Shoulder Horizontal Abduction with Resistance  - 1 x daily - 7 x weekly - 3 sets - 10 reps - Shoulder External Rotation and Scapular Retraction with Resistance  - 1 x daily - 7 x weekly - 2 sets - 10 reps - Prone Shoulder Extension - Single Arm  - 2 x daily - 7 x weekly - 2 sets - 10 reps - Prone Shoulder Row  - 2 x daily - 7 x weekly - 2 sets - 10 reps - Prone Single Arm Shoulder Horizontal Abduction with Scapular Retraction and Palm Down  - 2 x daily - 7 x weekly - 2 sets - 10 reps - Sidelying Shoulder External Rotation  - 2 x daily - 7 x weekly - 2 sets - 10 reps - Single Arm Serratus Punches in Supine with Dumbbell  - 2 x daily - 7 x weekly - 2 sets - 10 reps  ASSESSMENT:  CLINICAL IMPRESSION: Ms Lafevers is progressing appropriately.  She is feeling significant relief with the dry needling. She is tolerating postural excercises well.  She is well motivated and compliant.  She should continue to improve.    Patient would benefit from an additional 2x/week for 8 weeks to continue to progress with decreased pain with functional activities.   OBJECTIVE IMPAIRMENTS decreased cognition, decreased ROM, decreased strength, increased fascial restrictions, increased muscle spasms, impaired flexibility, postural dysfunction, and pain.   ACTIVITY LIMITATIONS carrying, lifting, sleeping,  transfers, bed mobility, dressing, reach over head, and hygiene/grooming  PARTICIPATION LIMITATIONS: meal prep, cleaning, laundry, driving, shopping, community activity, occupation, and yard work  PERSONAL FACTORS Behavior pattern, Fitness, Past/current experiences, Time since onset of injury/illness/exacerbation, and 1-2 comorbidities: insomnia,  are also affecting patient's functional outcome.   REHAB POTENTIAL: Good  CLINICAL DECISION MAKING: Stable/uncomplicated  EVALUATION COMPLEXITY: Low   GOALS: Goals reviewed with patient? Yes  SHORT TERM GOALS: Target date: 09/10/2022   Patient will be independent with initial HEP  Baseline: na Goal status: MET  2.  Pain report to be no greater than 4/10  Baseline: 6/10 (1/023/23) Goal status: in progress   3.  Patient to be able to fall asleep no later than 10pm and sleep consecutive 4 hours Baseline: na Goal status: MET on 09/12/2022 with pt reporting that she can sleep for 5 hours, pain no longer waking her up at night   LONG TERM GOALS: Target date: 11/29/2022  Patient to be independent with advanced HEP  Baseline: na Goal status: IN PROGRESS  2.  Patient to report pain no greater than 2/10  Baseline: na Goal status: INITIAL  3.  Patient to be able to sleep through the night  Baseline: na Goal status: MET on 09/30/2022  4.  Patient to begin and maintain a consistent exercise regimen Baseline: na Goal status: IN PROGRESS  5.  Cervical ROM to be [facility] Baseline: na Goal status: MET on 10/07/2022  6.  UE and postural strength to be 4+/5 Baseline: na Goal status: IN PROGRESS   PLAN: PT FREQUENCY: 1-2x/week  PT DURATION: 8 weeks  PLANNED INTERVENTIONS: Therapeutic exercises, Therapeutic activity, Neuromuscular re-education, Balance training, Patient/Family education, Self Care, Joint mobilization, Aquatic Therapy, Dry Needling, Electrical stimulation, Spinal mobilization, Cryotherapy, Moist heat, scar mobilization,  Taping, Traction, Ultrasound, Ionotophoresis 51m/ml Dexamethasone, Manual therapy, and Re-evaluation  PLAN FOR NEXT SESSION: Manual/Dry needling as indicated, possible use of traction, flexibility, progress postural strength   Jaan Fischel B. Kurt Hoffmeier, PT 10/16/22 11:08 PM  [facility] [address] [place] [address]Phone # [phone] [phone]

## 2022-10-29 ENCOUNTER — Ambulatory Visit: Payer: Medicare Other | Attending: Sports Medicine

## 2022-10-29 DIAGNOSIS — R293 Abnormal posture: Secondary | ICD-10-CM | POA: Diagnosis not present

## 2022-10-29 DIAGNOSIS — M6281 Muscle weakness (generalized): Secondary | ICD-10-CM | POA: Diagnosis not present

## 2022-10-29 DIAGNOSIS — M542 Cervicalgia: Secondary | ICD-10-CM | POA: Insufficient documentation

## 2022-10-29 DIAGNOSIS — R252 Cramp and spasm: Secondary | ICD-10-CM | POA: Diagnosis not present

## 2022-10-29 NOTE — Therapy (Signed)
OUTPATIENT PHYSICAL THERAPY TREATMENT NOTE AND REASSESSMENT   Patient Name: Charlene Hunt MRN: [id number] DOB:Mar 09, 1951, 72 y.o., female Today's Date: 10/29/2022     PT End of Session - 10/29/22 1106     Visit Number 14    Date for PT Re-Evaluation 11/29/22    Authorization Type [employer]ICARE    Progress Note Due on Visit 25    PT Start Time 1108    PT Stop Time 1143    PT Time Calculation (min) 35 min    Activity Tolerance Patient tolerated treatment well    Behavior During Therapy WFL for tasks assessed/performed              Past Medical History:  Diagnosis Date   Abnormal finding on thyroid function test    Acid reflux    ADD (attention deficit disorder)    Allergies    Anxiety    Asymptomatic menopausal state    Current every day vaping    Depression    Diverticulosis    Elevated coronary artery calcium score    Endometrial disorder    Erythema nodosum    Family history of breast cancer    Flat feet    GERD (gastroesophageal reflux disease)    H/O menorrhagia    Herpesviral infection of urogenital system    Hypothyroidism    Insomnia    Memory impairment    Migraine    OSA (obstructive sleep apnea)    Osteopenia    Plantar fasciitis    Sleep disorder    Vitamin D deficiency    Past Surgical History:  Procedure Laterality Date   C SECTIONS     CATARACTS     DILATION AND CURETTAGE OF UTERUS     ENDOMETRIAL ABLATION     NASAL SEPTOPLASTY FOR DEVIATED SEPTUM     TONSILLECTOMY     TORN RETINA     [place]     Patient Active Problem List   Diagnosis Date Noted   Cervicalgia 08/13/2022    PCP: Glenis Smoker, MD  REFERRING PROVIDER: Inez Catalina, MD   REFERRING DIAG: M54.2 (ICD-10-CM) - Cervicalgia   THERAPY DIAG:  Cervicalgia  Muscle weakness (generalized)  Cramp and spasm  Abnormal posture  Rationale for Evaluation and Treatment Rehabilitation  ONSET DATE: 08/08/2022   SUBJECTIVE:  SUBJECTIVE STATEMENT:  Pt reports she's had a setback.  Pain actually on the right upper trap and right side of neck today.  She denies any stress or tension over the past few days.    PERTINENT HISTORY:  Sleep apnea, insomnia, excessive stress  PAIN:  Are you having pain? Yes: NPRS scale: 4-5/10 Pain location: left side of neck and upper trap Pain description: aching Aggravating factors: nothing in particular Relieving factors: Tylenol, heat  PRECAUTIONS: None  WEIGHT BEARING RESTRICTIONS No  FALLS:  Has patient fallen in last 6 months? Yes. Number of falls was carrying her grandson up some steps and he was writhing around.   OCCUPATION: retired Agricultural engineer  PLOF: Independent, Independent with basic ADLs, Independent with household mobility without device, Independent with community mobility without device, Independent with homemaking with ambulation, Independent with gait, and Independent with transfers  PATIENT GOALS   Less pain  OBJECTIVE:   DIAGNOSTIC FINDINGS:  na  PATIENT SURVEYS:  Eval:  FOTO 56 (goal is 60) 09/30/2022:  FOTO 61%   COGNITION: Overall cognitive status: Within functional limits for tasks assessed   SENSATION: WFL Patient reports history of some symptoms associated with carpal tunnel bilaterally and left thumb trigger finger,  also hx of mild left shoulder injury in yoga.   POSTURE: rounded shoulders and forward head slight kyphosis  PALPATION: Tender, tight bands, trigger points bilateral upper traps, parascapular areas Left > right    CERVICAL ROM:   Active ROM A/PROM (deg) eval A/ROM (Deg) 09/09/22 A/ROM (Deg) 10/07/22  Flexion WNL 65 65  Extension 50 50 55  Right lateral flexion 32 35 40  Left lateral flexion 30 35 38  Right rotation 42  55 60  Left rotation 45 55 60   (Blank rows = not tested)  UPPER EXTREMITY ROM:  WNL  UPPER EXTREMITY MMT:  Eval:  Left shoulder ER and scaption with IR both 3+/5,  all others generally 4+/5  CERVICAL SPECIAL TESTS:  Eval:  Spurling's test: Negative   FUNCTIONAL TESTS:  Eval:  5 times sit to stand : 14.06 sec  10/07/2022: 5 times sit to stand:  8.24 sec  TODAY'S TREATMENT:  10/29/2021: UBE level 1.0 x3 min each direction with PT present to discuss status Prone with 2#:  shoulder extension, rows, horizontal abduction.  X20 bilat Sidelying ER with 2# 2x10 bilat Supine serratus punch 2# 2 x 10 bilateral Trigger Point Dry-Needling  Treatment instructions: Expect mild to moderate muscle soreness. S/S of pneumothorax if dry needled over a lung field, and to seek immediate medical attention should they occur. Patient verbalized understanding of these instructions and education.  Patient Consent Given: Yes Education handout provided: Yes Muscles treated: suboccipitals, bilateral upper traps, cervical paraspinals Electrical stimulation performed: No Parameters: N/A Treatment response/outcome: PT used manual palpation to identify trigger points and tight bands in the about muscle groups, dry needling administered to these areas, twitch response noted in bilateral upper traps along with resulting muscle elongation.  Patient reported relief of pain and minimal soreness post treatment.     10/16/2022: UBE level 1.0 x3 min each direction with PT present to discuss status Standing rows and shoulder extension: green tband 2x10 bil each Standing bilateral shoulder ER and horizontal abduction with red band 2 x 10 Manual Therapy:  soft tissue mobilization to cervical paraspinals, upper traps, and rhomboids.  Sub-occipital release and manual trigger point release Trigger Point Dry-Needling  Treatment instructions: Expect mild to moderate muscle soreness. S/S of  pneumothorax if dry needled over  a lung field, and to seek immediate medical attention should they occur. Patient verbalized understanding of these instructions and education.  Patient Consent Given: Yes Education handout provided: Yes Muscles treated: thoracic multifidi, rhomboids,subscapularis, suboccipitals, bilateral upper traps Electrical stimulation performed: No Parameters: N/A Treatment response/outcome: PT used manual palpation to identify trigger points and tight bands in the about muscle groups, dry needling administered to these areas, twitch response noted in bilateral upper traps along with resulting muscle elongation.  Patient reported relief of pain and minimal soreness post treatment.    10/07/2022: UBE level 1.0 x3 min each direction with PT present to discuss status Seated SNAGS for cervical rotation 2x10 bilat Seated shoulder ER and horizontal abduction with red tband 2x10 Standing rows and shoulder extension: green tband 2x10 bil each Standing lat pull with 25# 2x10 Counter stretch at barre 2x20 sec Barre push-ups 2x10 Prone with 2#:  shoulder extension, rows, horizontal abduction.  X20 bilat Sidelying ER with 2# 2x10 bilat    PATIENT EDUCATION:  Education details: Initiated HEP, educated on dry needling and provided handout Person educated: Patient Education method: Consulting civil engineer, Media planner, Verbal cues, and Handouts Education comprehension: verbalized understanding, returned demonstration, and verbal cues required  HOME EXERCISE PROGRAM: Access Code: [email or web address] URL: [email or web address] Date: 09/30/2022 Prepared by: Shelby Dubin Menke  Exercises - Seated Upper Trapezius Stretch  - 1 x daily - 7 x weekly - 1 sets - 2 reps - 20 sec hold - Seated Levator Scapulae Stretch  - 1 x daily - 7 x weekly - 1 sets - 2 reps - 20 sec hold - Seated Scapular Retraction  - 1 x daily - 7 x weekly - 3 sets - 10 reps - Shoulder Rolls in Sitting  - 1 x daily - 7 x weekly - 3 sets - 10 reps - Seated  Cervical Retraction  - 1 x daily - 7 x weekly - 3 sets - 10 reps - Seated Assisted Cervical Rotation with Towel  - 1 x daily - 7 x weekly - 2 sets - 10 reps - Shoulder extension with resistance - Neutral  - 1 x daily - 7 x weekly - 3 sets - 10 reps - Standing Shoulder Row with Anchored Resistance  - 1 x daily - 7 x weekly - 3 sets - 10 reps - Seated Shoulder Horizontal Abduction with Resistance  - 1 x daily - 7 x weekly - 3 sets - 10 reps - Shoulder External Rotation and Scapular Retraction with Resistance  - 1 x daily - 7 x weekly - 2 sets - 10 reps - Prone Shoulder Extension - Single Arm  - 2 x daily - 7 x weekly - 2 sets - 10 reps - Prone Shoulder Row  - 2 x daily - 7 x weekly - 2 sets - 10 reps - Prone Single Arm Shoulder Horizontal Abduction with Scapular Retraction and Palm Down  - 2 x daily - 7 x weekly - 2 sets - 10 reps - Sidelying Shoulder External Rotation  - 2 x daily - 7 x weekly - 2 sets - 10 reps - Single Arm Serratus Punches in Supine with Dumbbell  - 2 x daily - 7 x weekly - 2 sets - 10 reps  ASSESSMENT:  CLINICAL IMPRESSION: Ms Northway had more pain on right side today, however, left upper trap with more twitch responses during dry needling.   She was able to complete all shoulder exercises with moderate fatigue.  She reported soreness following treatment.  Patient would benefit from an additional 2x/week for 8 weeks to continue to progress with decreased pain with functional activities.   OBJECTIVE IMPAIRMENTS decreased cognition, decreased ROM, decreased strength, increased fascial restrictions, increased muscle spasms, impaired flexibility, postural dysfunction, and pain.   ACTIVITY LIMITATIONS carrying, lifting, sleeping, transfers, bed mobility, dressing, reach over head, and hygiene/grooming  PARTICIPATION LIMITATIONS: meal prep, cleaning, laundry, driving, shopping, community activity, occupation, and yard work  PERSONAL FACTORS Behavior pattern, Fitness, Past/current  experiences, Time since onset of injury/illness/exacerbation, and 1-2 comorbidities: insomnia,  are also affecting patient's functional outcome.   REHAB POTENTIAL: Good  CLINICAL DECISION MAKING: Stable/uncomplicated  EVALUATION COMPLEXITY: Low   GOALS: Goals reviewed with patient? Yes  SHORT TERM GOALS: Target date: 09/10/2022   Patient will be independent with initial HEP  Baseline: na Goal status: MET  2.  Pain report to be no greater than 4/10  Baseline: 6/10 (1/023/23) Goal status: in progress   3.  Patient to be able to fall asleep no later than 10pm and sleep consecutive 4 hours Baseline: na Goal status: MET on 09/12/2022 with pt reporting that she can sleep for 5 hours, pain no longer waking her up at night   LONG TERM GOALS: Target date: 11/29/2022  Patient to be independent with advanced HEP  Baseline: na Goal status: IN PROGRESS  2.  Patient to report pain no greater than 2/10  Baseline: na Goal status: INITIAL  3.  Patient to be able to sleep through the night  Baseline: na Goal status: MET on 09/30/2022  4.  Patient to begin and maintain a consistent exercise regimen Baseline: na Goal status: IN PROGRESS  5.  Cervical ROM to be [facility] Baseline: na Goal status: MET on 10/07/2022  6.  UE and postural strength to be 4+/5 Baseline: na Goal status: IN PROGRESS   PLAN: PT FREQUENCY: 1-2x/week  PT DURATION: 8 weeks  PLANNED INTERVENTIONS: Therapeutic exercises, Therapeutic activity, Neuromuscular re-education, Balance training, Patient/Family education, Self Care, Joint mobilization, Aquatic Therapy, Dry Needling, Electrical stimulation, Spinal mobilization, Cryotherapy, Moist heat, scar mobilization, Taping, Traction, Ultrasound, Ionotophoresis 28m/ml Dexamethasone, Manual therapy, and Re-evaluation  PLAN FOR NEXT SESSION: Manual/Dry needling as indicated, possible use of traction, flexibility, progress postural strength   Raihan Kimmel B. Teron Blais,  PT 10/29/22 11:48 AM  [address] [place] [address]Phone # [phone] [phone]

## 2022-10-31 ENCOUNTER — Ambulatory Visit: Payer: Medicare Other | Admitting: Rehabilitative and Restorative Service Providers"

## 2022-11-05 ENCOUNTER — Ambulatory Visit: Payer: Medicare Other

## 2022-11-05 DIAGNOSIS — M6281 Muscle weakness (generalized): Secondary | ICD-10-CM

## 2022-11-05 DIAGNOSIS — R252 Cramp and spasm: Secondary | ICD-10-CM | POA: Diagnosis not present

## 2022-11-05 DIAGNOSIS — R293 Abnormal posture: Secondary | ICD-10-CM

## 2022-11-05 DIAGNOSIS — M542 Cervicalgia: Secondary | ICD-10-CM | POA: Diagnosis not present

## 2022-11-05 NOTE — Therapy (Signed)
OUTPATIENT PHYSICAL THERAPY TREATMENT NOTE    Patient Name: Charlene Hunt MRN: [id number] DOB:03/10/1951, 72 y.o., female Today's Date: 11/05/2022     PT End of Session - 11/05/22 1155     Visit Number 15    Date for PT Re-Evaluation 11/29/22    Authorization Type [employer]IC[employer]    Progress Note Due on Visit 20    PT Start Time 1149    PT Stop Time 1223    PT Time Calculation (min) 34 min    Activity Tolerance Patient tolerated treatment well    Behavior During Therapy WFL for tasks assessed/performed              Past Medical History:  Diagnosis Date   Abnormal finding on thyroid function test    Acid reflux    ADD (attention deficit disorder)    Allergies    Anxiety    Asymptomatic menopausal state    Current every day vaping    Depression    Diverticulosis    Elevated coronary artery calcium score    Endometrial disorder    Erythema nodosum    Family history of breast cancer    Flat feet    GERD (gastroesophageal reflux disease)    H/O menorrhagia    Herpesviral infection of urogenital system    Hypothyroidism    Insomnia    Memory impairment    Migraine    OSA (obstructive sleep apnea)    Osteopenia    Plantar fasciitis    Sleep disorder    Vitamin D deficiency    Past Surgical History:  Procedure Laterality Date   C SECTIONS     CATARACTS     DILATION AND CURETTAGE OF UTERUS     ENDOMETRIAL ABLATION     NASAL SEPTOPLASTY FOR DEVIATED SEPTUM     TONSILLECTOMY     TORN RETINA     [facility]     Patient Active Problem List   Diagnosis Date Noted   Cervicalgia 08/13/2022    PCP: Shon Hale, MD  REFERRING PROVIDER: Christena Deem, MD   REFERRING DIAG: M54.2 (ICD-10-CM) - Cervicalgia   THERAPY DIAG:  Cervicalgia  Muscle weakness (generalized)  Cramp and spasm  Abnormal posture  Rationale for Evaluation and Treatment Rehabilitation  ONSET DATE: 08/08/2022   SUBJECTIVE:  SUBJECTIVE STATEMENT:  Pt reports she had her 3rd Covid vaccine and is really aching and not feeling well so it is hard to distinguish the pain.  "I was really sore after last session for 2 days".    PERTINENT HISTORY:  Sleep apnea, insomnia, excessive stress  PAIN:  Are you having pain? Yes: NPRS scale: 4-5/10 Pain location: left side of neck and upper trap Pain description: aching Aggravating factors: nothing in particular Relieving factors: Tylenol, heat  PRECAUTIONS: None  WEIGHT BEARING RESTRICTIONS No  FALLS:  Has patient fallen in last 6 months? Yes. Number of falls was carrying her grandson up some steps and he was writhing around.   OCCUPATION: retired Agricultural engineer  PLOF: Independent, Independent with basic ADLs, Independent with household mobility without device, Independent with community mobility without device, Independent with homemaking with ambulation, Independent with gait, and Independent with transfers  PATIENT GOALS   Less pain  OBJECTIVE:   DIAGNOSTIC FINDINGS:  na  PATIENT SURVEYS:  Eval:  FOTO 56 (goal is 60) 09/30/2022:  FOTO 61%   COGNITION: Overall cognitive status: Within functional limits for tasks assessed   SENSATION: WFL Patient reports history of some symptoms associated with carpal tunnel bilaterally and left thumb trigger finger,  also hx of mild left shoulder injury in yoga.   POSTURE: rounded shoulders and forward head slight kyphosis  PALPATION: Tender, tight bands, trigger points bilateral upper traps, parascapular areas Left > right    CERVICAL ROM:   Active ROM A/PROM (deg) eval A/ROM (Deg) 09/09/22 A/ROM (Deg) 10/07/22  Flexion WNL 65 65  Extension 50 50 55  Right lateral flexion 32 35 40  Left lateral flexion 30 35 38  Right rotation 42 55  60  Left rotation 45 55 60   (Blank rows = not tested)  UPPER EXTREMITY ROM:  WNL  UPPER EXTREMITY MMT:  Eval:  Left shoulder ER and scaption with IR both 3+/5,  all others generally 4+/5  CERVICAL SPECIAL TESTS:  Eval:  Spurling's test: Negative   FUNCTIONAL TESTS:  Eval:  5 times sit to stand : 14.06 sec  10/07/2022: 5 times sit to stand:  8.24 sec  TODAY'S TREATMENT:  11/05/2021: UBE level 1.0 x3 min each direction with PT present to discuss status Trigger Point Dry-Needling  Treatment instructions: Expect mild to moderate muscle soreness. S/S of pneumothorax if dry needled over a lung field, and to seek immediate medical attention should they occur. Patient verbalized understanding of these instructions and education.  Patient Consent Given: Yes Education handout provided: Yes Muscles treated: right upper trap, right rhomboids and parascapular areas Electrical stimulation performed: No Parameters: N/A Treatment response/outcome: PT used manual palpation to identify trigger points and tight bands in and about the about muscle groups above, dry needling administered to these areas, twitch response noted in right rhomboids and parascapular areas  along with resulting muscle elongation.  Patient reported relief of pain and minimal soreness post treatment.    10/29/2021: UBE level 1.0 x3 min each direction with PT present to discuss status Prone with 2#:  shoulder extension, rows, horizontal abduction.  X20 bilat Sidelying ER with 2# 2x10 bilat Supine serratus punch 2# 2 x 10 bilateral Trigger Point Dry-Needling  Treatment instructions: Expect mild to moderate muscle soreness. S/S of pneumothorax if dry needled over a lung field, and to seek immediate medical attention should they occur. Patient verbalized understanding of these instructions and education.  Patient Consent Given: Yes Education handout provided: Yes  Muscles treated: suboccipitals, bilateral upper traps, cervical  paraspinals Electrical stimulation performed: No Parameters: N/A Treatment response/outcome: PT used manual palpation to identify trigger points and tight bands in the about muscle groups, dry needling administered to these areas, twitch response noted in bilateral upper traps along with resulting muscle elongation.  Patient reported relief of pain and minimal soreness post treatment.     10/16/2022: UBE level 1.0 x3 min each direction with PT present to discuss status Standing rows and shoulder extension: green tband 2x10 bil each Standing bilateral shoulder ER and horizontal abduction with red band 2 x 10 Manual Therapy:  soft tissue mobilization to cervical paraspinals, upper traps, and rhomboids.  Sub-occipital release and manual trigger point release Trigger Point Dry-Needling  Treatment instructions: Expect mild to moderate muscle soreness. S/S of pneumothorax if dry needled over a lung field, and to seek immediate medical attention should they occur. Patient verbalized understanding of these instructions and education.  Patient Consent Given: Yes Education handout provided: Yes Muscles treated: thoracic multifidi, rhomboids,subscapularis, suboccipitals, bilateral upper traps Electrical stimulation performed: No Parameters: N/A Treatment response/outcome: PT used manual palpation to identify trigger points and tight bands in the about muscle groups, dry needling administered to these areas, twitch response noted in bilateral upper traps along with resulting muscle elongation.  Patient reported relief of pain and minimal soreness post treatment.     PATIENT EDUCATION:  Education details: Initiated HEP, educated on dry needling and provided handout Person educated: Patient Education method: Programmer, multimedia, Facilities manager, Verbal cues, and Handouts Education comprehension: verbalized understanding, returned demonstration, and verbal cues required  HOME EXERCISE PROGRAM: Access Code:  [email or web address] URL: [email or web address] Date: 09/30/2022 Prepared by: Clydie Braun Menke  Exercises - Seated Upper Trapezius Stretch  - 1 x daily - 7 x weekly - 1 sets - 2 reps - 20 sec hold - Seated Levator Scapulae Stretch  - 1 x daily - 7 x weekly - 1 sets - 2 reps - 20 sec hold - Seated Scapular Retraction  - 1 x daily - 7 x weekly - 3 sets - 10 reps - Shoulder Rolls in Sitting  - 1 x daily - 7 x weekly - 3 sets - 10 reps - Seated Cervical Retraction  - 1 x daily - 7 x weekly - 3 sets - 10 reps - Seated Assisted Cervical Rotation with Towel  - 1 x daily - 7 x weekly - 2 sets - 10 reps - Shoulder extension with resistance - Neutral  - 1 x daily - 7 x weekly - 3 sets - 10 reps - Standing Shoulder Row with Anchored Resistance  - 1 x daily - 7 x weekly - 3 sets - 10 reps - Seated Shoulder Horizontal Abduction with Resistance  - 1 x daily - 7 x weekly - 3 sets - 10 reps - Shoulder External Rotation and Scapular Retraction with Resistance  - 1 x daily - 7 x weekly - 2 sets - 10 reps - Prone Shoulder Extension - Single Arm  - 2 x daily - 7 x weekly - 2 sets - 10 reps - Prone Shoulder Row  - 2 x daily - 7 x weekly - 2 sets - 10 reps - Prone Single Arm Shoulder Horizontal Abduction with Scapular Retraction and Palm Down  - 2 x daily - 7 x weekly - 2 sets - 10 reps - Sidelying Shoulder External Rotation  - 2 x daily - 7 x weekly - 2 sets - 10 reps - Single  Arm Serratus Punches in Supine with Dumbbell  - 2 x daily - 7 x weekly - 2 sets - 10 reps  ASSESSMENT:  CLINICAL IMPRESSION: Ms Polus had several twitch responses in the right rhomboid area.  We held on the shoulder exercises as she states she got really sore for 2 days after last sessio.  We are doing this to distinguish whether the soreness is coming from the dry needling or the shoulder exercises.  Patient would benefit from an additional 2x/week for 8 weeks to continue to progress with decreased pain with functional  activities.   OBJECTIVE IMPAIRMENTS decreased cognition, decreased ROM, decreased strength, increased fascial restrictions, increased muscle spasms, impaired flexibility, postural dysfunction, and pain.   ACTIVITY LIMITATIONS carrying, lifting, sleeping, transfers, bed mobility, dressing, reach over head, and hygiene/grooming  PARTICIPATION LIMITATIONS: meal prep, cleaning, laundry, driving, shopping, community activity, occupation, and yard work  PERSONAL FACTORS Behavior pattern, Fitness, Past/current experiences, Time since onset of injury/illness/exacerbation, and 1-2 comorbidities: insomnia,  are also affecting patient's functional outcome.   REHAB POTENTIAL: Good  CLINICAL DECISION MAKING: Stable/uncomplicated  EVALUATION COMPLEXITY: Low   GOALS: Goals reviewed with patient? Yes  SHORT TERM GOALS: Target date: 09/10/2022   Patient will be independent with initial HEP  Baseline: na Goal status: MET  2.  Pain report to be no greater than 4/10  Baseline: 6/10 (1/023/23) Goal status: in progress   3.  Patient to be able to fall asleep no later than 10pm and sleep consecutive 4 hours Baseline: na Goal status: MET on 09/12/2022 with pt reporting that she can sleep for 5 hours, pain no longer waking her up at night   LONG TERM GOALS: Target date: 11/29/2022  Patient to be independent with advanced HEP  Baseline: na Goal status: IN PROGRESS  2.  Patient to report pain no greater than 2/10  Baseline: na Goal status: INITIAL  3.  Patient to be able to sleep through the night  Baseline: na Goal status: MET on 09/30/2022  4.  Patient to begin and maintain a consistent exercise regimen Baseline: na Goal status: IN PROGRESS  5.  Cervical ROM to be [facility] Baseline: na Goal status: MET on 10/07/2022  6.  UE and postural strength to be 4+/5 Baseline: na Goal status: IN PROGRESS   PLAN: PT FREQUENCY: 1-2x/week  PT DURATION: 8 weeks  PLANNED INTERVENTIONS: Therapeutic  exercises, Therapeutic activity, Neuromuscular re-education, Balance training, Patient/Family education, Self Care, Joint mobilization, Aquatic Therapy, Dry Needling, Electrical stimulation, Spinal mobilization, Cryotherapy, Moist heat, scar mobilization, Taping, Traction, Ultrasound, Ionotophoresis 4mg /ml Dexamethasone, Manual therapy, and Re-evaluation  PLAN FOR NEXT SESSION: Manual/Dry needling as indicated, possible use of traction, flexibility, progress postural strength   Tykeisha Peer B. Aminta Sakurai, PT 11/05/22 12:31 PM  [facility] [address] [place], [place] Phone # [phone] Fax [phone]

## 2022-11-07 ENCOUNTER — Encounter: Payer: Self-pay | Admitting: Rehabilitative and Restorative Service Providers"

## 2022-11-07 ENCOUNTER — Ambulatory Visit: Payer: Medicare Other | Admitting: Rehabilitative and Restorative Service Providers"

## 2022-11-07 DIAGNOSIS — M542 Cervicalgia: Secondary | ICD-10-CM | POA: Diagnosis not present

## 2022-11-07 DIAGNOSIS — R293 Abnormal posture: Secondary | ICD-10-CM | POA: Diagnosis not present

## 2022-11-07 DIAGNOSIS — M6281 Muscle weakness (generalized): Secondary | ICD-10-CM | POA: Diagnosis not present

## 2022-11-07 DIAGNOSIS — R252 Cramp and spasm: Secondary | ICD-10-CM

## 2022-11-07 NOTE — Therapy (Signed)
OUTPATIENT PHYSICAL THERAPY TREATMENT NOTE    Patient Name: Selena Swaminathan MRN: [id number] DOB:May 19, 1951, 72 y.o., female Today's Date: 11/07/2022     PT End of Session - 11/07/22 1103     Visit Number 16    Date for PT Re-Evaluation 11/29/22    Authorization Type [employer]    Progress Note Due on Visit 20    PT Start Time 1101    PT Stop Time 1140    PT Time Calculation (min) 39 min    Activity Tolerance Patient tolerated treatment well    Behavior During Therapy WFL for tasks assessed/performed              Past Medical History:  Diagnosis Date   Abnormal finding on thyroid function test    Acid reflux    ADD (attention deficit disorder)    Allergies    Anxiety    Asymptomatic menopausal state    Current every day vaping    Depression    Diverticulosis    Elevated coronary artery calcium score    Endometrial disorder    Erythema nodosum    Family history of breast cancer    Flat feet    GERD (gastroesophageal reflux disease)    H/O menorrhagia    Herpesviral infection of urogenital system    Hypothyroidism    Insomnia    Memory impairment    Migraine    OSA (obstructive sleep apnea)    Osteopenia    Plantar fasciitis    Sleep disorder    Vitamin D deficiency    Past Surgical History:  Procedure Laterality Date   C SECTIONS     CATARACTS     DILATION AND CURETTAGE OF UTERUS     ENDOMETRIAL ABLATION     NASAL SEPTOPLASTY FOR DEVIATED SEPTUM     TONSILLECTOMY     TORN RETINA     WISDOMTEETH     Patient Active Problem List   Diagnosis Date Noted   Cervicalgia 08/13/2022    PCP: Shon Hale, MD  REFERRING PROVIDER: Christena Deem, MD   REFERRING DIAG: M54.2 (ICD-10-CM) - Cervicalgia   THERAPY DIAG:  Cervicalgia  Muscle weakness (generalized)  Cramp and spasm  Abnormal posture  Rationale for Evaluation and Treatment Rehabilitation  ONSET DATE: 08/08/2022   SUBJECTIVE:  SUBJECTIVE STATEMENT:  Pt reports feeling better after her covid vaccine.  PERTINENT HISTORY:  Sleep apnea, insomnia, excessive stress  PAIN:  Are you having pain? Yes: NPRS scale: 4/10 Pain location: left side of neck and upper trap Pain description: aching Aggravating factors: nothing in particular Relieving factors: Tylenol, heat  PRECAUTIONS: None  WEIGHT BEARING RESTRICTIONS No  FALLS:  Has patient fallen in last 6 months? Yes. Number of falls was carrying her grandson up some steps and he was writhing around.   OCCUPATION: retired Systems developer  PLOF: Independent, Independent with basic ADLs, Independent with household mobility without device, Independent with community mobility without device, Independent with homemaking with ambulation, Independent with gait, and Independent with transfers  PATIENT GOALS   Less pain  OBJECTIVE:   DIAGNOSTIC FINDINGS:  na  PATIENT SURVEYS:  Eval:  FOTO 63 (goal is 60) 09/30/2022:  FOTO 61%   COGNITION: Overall cognitive status: Within functional limits for tasks assessed   SENSATION: WFL Patient reports history of some symptoms associated with carpal tunnel bilaterally and left thumb trigger finger,  also hx of mild left shoulder injury in yoga.   POSTURE: rounded shoulders and forward head slight kyphosis  PALPATION: Tender, tight bands, trigger points bilateral upper traps, parascapular areas Left > right    CERVICAL ROM:   Active ROM A/PROM (deg) eval A/ROM (Deg) 09/09/22 A/ROM (Deg) 10/07/22  Flexion WNL 65 65  Extension 50 50 55  Right lateral flexion 32 35 40  Left lateral flexion 30 35 38  Right rotation 42 55 60  Left rotation 45 55 60   (Blank rows = not tested)  UPPER EXTREMITY ROM:  WNL  UPPER EXTREMITY MMT:  Eval:   Left shoulder ER and scaption with IR both 3+/5,  all others generally 4+/5  CERVICAL SPECIAL TESTS:  Eval:  Spurling's test: Negative   FUNCTIONAL TESTS:  Eval:  5 times sit to stand : 14.06 sec  10/07/2022: 5 times sit to stand:  8.24 sec  TODAY'S TREATMENT:   11/07/2022: UBE level 1.2 x3 min each direction with PT present to discuss status Standing rows and shoulder extension with green tband 2x10 each Upper trap and levator stretch 2x20 sec Standing 3 way scapular stabilization on wall with blue loop 2x5 bilat Seated shoulder ER and horizontal abduction with red tband 2x10 Seated 3 way shoulder elevation with 2# 2x10 bilat Supine serratus punch 3# 2x10 bilateral Sidelying ER with 2# 2x10 bilat   11/05/2021: UBE level 1.0 x3 min each direction with PT present to discuss status Trigger Point Dry-Needling  Treatment instructions: Expect mild to moderate muscle soreness. S/S of pneumothorax if dry needled over a lung field, and to seek immediate medical attention should they occur. Patient verbalized understanding of these instructions and education.  Patient Consent Given: Yes Education handout provided: Yes Muscles treated: right upper trap, right rhomboids and parascapular areas Electrical stimulation performed: No Parameters: N/A Treatment response/outcome: PT used manual palpation to identify trigger points and tight bands in and about the about muscle groups above, dry needling administered to these areas, twitch response noted in right rhomboids and parascapular areas  along with resulting muscle elongation.  Patient reported relief of pain and minimal soreness post treatment.    10/29/2021: UBE level 1.0 x3 min each direction with PT present to discuss status Prone with 2#:  shoulder extension, rows, horizontal abduction.  X20 bilat Sidelying ER with 2# 2x10 bilat Supine serratus punch 2# 2 x 10 bilateral  Trigger Point Dry-Needling  Treatment instructions: Expect mild to  moderate muscle soreness. S/S of pneumothorax if dry needled over a lung field, and to seek immediate medical attention should they occur. Patient verbalized understanding of these instructions and education.  Patient Consent Given: Yes Education handout provided: Yes Muscles treated: suboccipitals, bilateral upper traps, cervical paraspinals Electrical stimulation performed: No Parameters: N/A Treatment response/outcome: PT used manual palpation to identify trigger points and tight bands in the about muscle groups, dry needling administered to these areas, twitch response noted in bilateral upper traps along with resulting muscle elongation.  Patient reported relief of pain and minimal soreness post treatment.      PATIENT EDUCATION:  Education details: Initiated HEP, educated on dry needling and provided handout Person educated: Patient Education method: Consulting civil engineer, Media planner, Verbal cues, and Handouts Education comprehension: verbalized understanding, returned demonstration, and verbal cues required  HOME EXERCISE PROGRAM: Access Code: [email or web address] URL: [email or web address] Date: 09/30/2022 Prepared by: Shelby Dubin Donnelle Olmeda  Exercises - Seated Upper Trapezius Stretch  - 1 x daily - 7 x weekly - 1 sets - 2 reps - 20 sec hold - Seated Levator Scapulae Stretch  - 1 x daily - 7 x weekly - 1 sets - 2 reps - 20 sec hold - Seated Scapular Retraction  - 1 x daily - 7 x weekly - 3 sets - 10 reps - Shoulder Rolls in Sitting  - 1 x daily - 7 x weekly - 3 sets - 10 reps - Seated Cervical Retraction  - 1 x daily - 7 x weekly - 3 sets - 10 reps - Seated Assisted Cervical Rotation with Towel  - 1 x daily - 7 x weekly - 2 sets - 10 reps - Shoulder extension with resistance - Neutral  - 1 x daily - 7 x weekly - 3 sets - 10 reps - Standing Shoulder Row with Anchored Resistance  - 1 x daily - 7 x weekly - 3 sets - 10 reps - Seated Shoulder Horizontal Abduction with Resistance  - 1 x daily -  7 x weekly - 3 sets - 10 reps - Shoulder External Rotation and Scapular Retraction with Resistance  - 1 x daily - 7 x weekly - 2 sets - 10 reps - Prone Shoulder Extension - Single Arm  - 2 x daily - 7 x weekly - 2 sets - 10 reps - Prone Shoulder Row  - 2 x daily - 7 x weekly - 2 sets - 10 reps - Prone Single Arm Shoulder Horizontal Abduction with Scapular Retraction and Palm Down  - 2 x daily - 7 x weekly - 2 sets - 10 reps - Sidelying Shoulder External Rotation  - 2 x daily - 7 x weekly - 2 sets - 10 reps - Single Arm Serratus Punches in Supine with Dumbbell  - 2 x daily - 7 x weekly - 2 sets - 10 reps  ASSESSMENT:  CLINICAL IMPRESSION: Ms Mcilvaine continues to progress with goal related activities.  States that she is feeling better from the soreness following her covid vaccine.  Patient able to progress with strengthening exercises during session and continues to demonstrate good technique with stretching.  Patient is progressing with improved posture and body mechanics.     OBJECTIVE IMPAIRMENTS decreased cognition, decreased ROM, decreased strength, increased fascial restrictions, increased muscle spasms, impaired flexibility, postural dysfunction, and pain.   ACTIVITY LIMITATIONS carrying, lifting, sleeping, transfers, bed mobility, dressing, reach over head, and hygiene/grooming  PARTICIPATION LIMITATIONS: meal prep,  cleaning, laundry, driving, shopping, community activity, occupation, and yard work  PERSONAL FACTORS Behavior pattern, Fitness, Past/current experiences, Time since onset of injury/illness/exacerbation, and 1-2 comorbidities: insomnia,  are also affecting patient's functional outcome.   REHAB POTENTIAL: Good  CLINICAL DECISION MAKING: Stable/uncomplicated  EVALUATION COMPLEXITY: Low   GOALS: Goals reviewed with patient? Yes  SHORT TERM GOALS: Target date: 09/10/2022   Patient will be independent with initial HEP  Baseline: na Goal status: MET  2.  Pain report  to be no greater than 4/10  Baseline: 6/10  Goal status: in progress (on 11/07/22 reports pain no greater than 5.5/10)  3.  Patient to be able to fall asleep no later than 10pm and sleep consecutive 4 hours Baseline: na Goal status: MET on 09/12/2022 with pt reporting that she can sleep for 5 hours, pain no longer waking her up at night   LONG TERM GOALS: Target date: 11/29/2022  Patient to be independent with advanced HEP  Baseline: na Goal status: IN PROGRESS  2.  Patient to report pain no greater than 2/10  Baseline: na Goal status: INITIAL  3.  Patient to be able to sleep through the night  Baseline: na Goal status: MET on 09/30/2022  4.  Patient to begin and maintain a consistent exercise regimen Baseline: na Goal status: IN PROGRESS  5.  Cervical ROM to be [facility] Baseline: na Goal status: MET on 10/07/2022  6.  UE and postural strength to be 4+/5 Baseline: na Goal status: IN PROGRESS   PLAN: PT FREQUENCY: 1-2x/week  PT DURATION: 8 weeks  PLANNED INTERVENTIONS: Therapeutic exercises, Therapeutic activity, Neuromuscular re-education, Balance training, Patient/Family education, Self Care, Joint mobilization, Aquatic Therapy, Dry Needling, Electrical stimulation, Spinal mobilization, Cryotherapy, Moist heat, scar mobilization, Taping, Traction, Ultrasound, Ionotophoresis 4mg /ml Dexamethasone, Manual therapy, and Re-evaluation  PLAN FOR NEXT SESSION: Manual/Dry needling as indicated, possible use of traction, flexibility, progress postural strength   Juel Burrow, PT 11/07/22 11:46 AM   [facility] [address] [place], [place] [address] Phone # [phone] Fax [phone]

## 2022-11-11 ENCOUNTER — Encounter: Payer: Medicare Other | Admitting: Rehabilitative and Restorative Service Providers"

## 2022-11-14 ENCOUNTER — Ambulatory Visit: Payer: Medicare Other

## 2022-11-14 DIAGNOSIS — R293 Abnormal posture: Secondary | ICD-10-CM | POA: Diagnosis not present

## 2022-11-14 DIAGNOSIS — R252 Cramp and spasm: Secondary | ICD-10-CM | POA: Diagnosis not present

## 2022-11-14 DIAGNOSIS — M6281 Muscle weakness (generalized): Secondary | ICD-10-CM | POA: Diagnosis not present

## 2022-11-14 DIAGNOSIS — M542 Cervicalgia: Secondary | ICD-10-CM | POA: Diagnosis not present

## 2022-11-14 NOTE — Therapy (Signed)
OUTPATIENT PHYSICAL THERAPY TREATMENT NOTE    Patient Name: Charlene Hunt MRN: [id number] DOB:November 06, 1950, 72 y.o., female Today's Date: 11/14/2022     PT End of Session - 11/14/22 1320     Visit Number 17    Date for PT Re-Evaluation 11/29/22    Authorization Type [employer]    Progress Note Due on Visit 20    PT Start Time 1145    PT Stop Time 1230    PT Time Calculation (min) 45 min    Activity Tolerance Patient tolerated treatment well    Behavior During Therapy WFL for tasks assessed/performed              Past Medical History:  Diagnosis Date   Abnormal finding on thyroid function test    Acid reflux    ADD (attention deficit disorder)    Allergies    Anxiety    Asymptomatic menopausal state    Current every day vaping    Depression    Diverticulosis    Elevated coronary artery calcium score    Endometrial disorder    Erythema nodosum    Family history of breast cancer    Flat feet    GERD (gastroesophageal reflux disease)    H/O menorrhagia    Herpesviral infection of urogenital system    Hypothyroidism    Insomnia    Memory impairment    Migraine    OSA (obstructive sleep apnea)    Osteopenia    Plantar fasciitis    Sleep disorder    Vitamin D deficiency    Past Surgical History:  Procedure Laterality Date   C SECTIONS     CATARACTS     DILATION AND CURETTAGE OF UTERUS     ENDOMETRIAL ABLATION     NASAL SEPTOPLASTY FOR DEVIATED SEPTUM     TONSILLECTOMY     TORN RETINA     WISDOMTEETH     Patient Active Problem List   Diagnosis Date Noted   Cervicalgia 08/13/2022    PCP: Shon Hale, MD  REFERRING PROVIDER: Christena Deem, MD   REFERRING DIAG: M54.2 (ICD-10-CM) - Cervicalgia   THERAPY DIAG:  Cervicalgia  Cramp and spasm  Abnormal posture  Muscle weakness (generalized)  Rationale for Evaluation and Treatment Rehabilitation  ONSET DATE: 08/08/2022   SUBJECTIVE:  SUBJECTIVE STATEMENT:  Pt reports she is doing ok,  having some pain on in bilateral upper traps and right parascapular area.   PERTINENT HISTORY:  Sleep apnea, insomnia, excessive stress  PAIN:  Are you having pain? Yes: NPRS scale: 4/10 Pain location: left side of neck and upper trap Pain description: aching Aggravating factors: nothing in particular Relieving factors: Tylenol, heat  PRECAUTIONS: None  WEIGHT BEARING RESTRICTIONS No  FALLS:  Has patient fallen in last 6 months? Yes. Number of falls was carrying her grandson up some steps and he was writhing around.   OCCUPATION: retired Systems developer  PLOF: Independent, Independent with basic ADLs, Independent with household mobility without device, Independent with community mobility without device, Independent with homemaking with ambulation, Independent with gait, and Independent with transfers  PATIENT GOALS   Less pain  OBJECTIVE:   DIAGNOSTIC FINDINGS:  na  PATIENT SURVEYS:  Eval:  FOTO 33 (goal is 60) 09/30/2022:  FOTO 61%   COGNITION: Overall cognitive status: Within functional limits for tasks assessed   SENSATION: WFL Patient reports history of some symptoms associated with carpal tunnel bilaterally and left thumb trigger finger,  also hx of mild left shoulder injury in yoga.   POSTURE: rounded shoulders and forward head slight kyphosis  PALPATION: Tender, tight bands, trigger points bilateral upper traps, parascapular areas Left > right    CERVICAL ROM:   Active ROM A/PROM (deg) eval A/ROM (Deg) 09/09/22 A/ROM (Deg) 10/07/22  Flexion WNL 65 65  Extension 50 50 55  Right lateral flexion 32 35 40  Left lateral flexion 30 35 38  Right rotation 42 55 60  Left rotation 45 55 60   (Blank rows = not tested)  UPPER  EXTREMITY ROM:  WNL  UPPER EXTREMITY MMT:  Eval:  Left shoulder ER and scaption with IR both 3+/5,  all others generally 4+/5  CERVICAL SPECIAL TESTS:  Eval:  Spurling's test: Negative   FUNCTIONAL TESTS:  Eval:  5 times sit to stand : 14.06 sec  10/07/2022: 5 times sit to stand:  8.24 sec  TODAY'S TREATMENT:  11/14/2022: UBE level 1.2 x3 min each direction with PT present to discuss status Reviewed all of HEP: modified prone scapular stabilization exercises to fwd bent  Trigger Point Dry-Needling  Treatment instructions: Expect mild to moderate muscle soreness. S/S of pneumothorax if dry needled over a lung field, and to seek immediate medical attention should they occur. Patient verbalized understanding of these instructions and education.  Patient Consent Given: Yes Education handout provided: Yes Muscles treated: bilateral upper traps, right rhomboids and parascapular areas Electrical stimulation performed: No Parameters: N/A Treatment response/outcome: PT used manual palpation to identify trigger points and tight bands in and about the about muscle groups above, dry needling administered to these areas, twitch response noted in right rhomboids and parascapular areas  along with resulting muscle elongation.  Patient reported relief of pain and minimal soreness post treatment.    11/07/2022: UBE level 1.2 x3 min each direction with PT present to discuss status Standing rows and shoulder extension with green tband 2x10 each Upper trap and levator stretch 2x20 sec Standing 3 way scapular stabilization on wall with blue loop 2x5 bilat Seated shoulder ER and horizontal abduction with red tband 2x10 Seated 3 way shoulder elevation with 2# 2x10 bilat Supine serratus punch 3# 2x10 bilateral Sidelying ER with 2# 2x10 bilat   11/05/2021: UBE level 1.0 x3 min each direction with PT present to discuss status Trigger Point  Dry-Needling  Treatment instructions: Expect mild to moderate  muscle soreness. S/S of pneumothorax if dry needled over a lung field, and to seek immediate medical attention should they occur. Patient verbalized understanding of these instructions and education.  Patient Consent Given: Yes Education handout provided: Yes Muscles treated: right upper trap, right rhomboids and parascapular areas Electrical stimulation performed: No Parameters: N/A Treatment response/outcome: PT used manual palpation to identify trigger points and tight bands in and about the about muscle groups above, dry needling administered to these areas, twitch response noted in right rhomboids and parascapular areas  along with resulting muscle elongation.  Patient reported relief of pain and minimal soreness post treatment.       PATIENT EDUCATION:  Education details: Initiated HEP, educated on dry needling and provided handout Person educated: Patient Education method: Consulting civil engineer, Media planner, Verbal cues, and Handouts Education comprehension: verbalized understanding, returned demonstration, and verbal cues required  HOME EXERCISE PROGRAM: Access Code: [email or web address] URL: [email or web address] Date: 09/30/2022 Prepared by: Shelby Dubin Menke  Exercises - Seated Upper Trapezius Stretch  - 1 x daily - 7 x weekly - 1 sets - 2 reps - 20 sec hold - Seated Levator Scapulae Stretch  - 1 x daily - 7 x weekly - 1 sets - 2 reps - 20 sec hold - Seated Scapular Retraction  - 1 x daily - 7 x weekly - 3 sets - 10 reps - Shoulder Rolls in Sitting  - 1 x daily - 7 x weekly - 3 sets - 10 reps - Seated Cervical Retraction  - 1 x daily - 7 x weekly - 3 sets - 10 reps - Seated Assisted Cervical Rotation with Towel  - 1 x daily - 7 x weekly - 2 sets - 10 reps - Shoulder extension with resistance - Neutral  - 1 x daily - 7 x weekly - 3 sets - 10 reps - Standing Shoulder Row with Anchored Resistance  - 1 x daily - 7 x weekly - 3 sets - 10 reps - Seated Shoulder Horizontal Abduction with  Resistance  - 1 x daily - 7 x weekly - 3 sets - 10 reps - Shoulder External Rotation and Scapular Retraction with Resistance  - 1 x daily - 7 x weekly - 2 sets - 10 reps - Prone Shoulder Extension - Single Arm  - 2 x daily - 7 x weekly - 2 sets - 10 reps - Prone Shoulder Row  - 2 x daily - 7 x weekly - 2 sets - 10 reps - Prone Single Arm Shoulder Horizontal Abduction with Scapular Retraction and Palm Down  - 2 x daily - 7 x weekly - 2 sets - 10 reps - Sidelying Shoulder External Rotation  - 2 x daily - 7 x weekly - 2 sets - 10 reps - Single Arm Serratus Punches in Supine with Dumbbell  - 2 x daily - 7 x weekly - 2 sets - 10 reps  ASSESSMENT:  CLINICAL IMPRESSION: Ms Palleschi is progressing appropriately.  She is doing well with her HEP and is compliant.  Multiple twitch responses noted in all dry needling areas.  She would benefit from continued skilled PT for postural strengthening and manual techniques to control symptoms.     OBJECTIVE IMPAIRMENTS decreased cognition, decreased ROM, decreased strength, increased fascial restrictions, increased muscle spasms, impaired flexibility, postural dysfunction, and pain.   ACTIVITY LIMITATIONS carrying, lifting, sleeping, transfers, bed mobility, dressing, reach over head, and hygiene/grooming  PARTICIPATION LIMITATIONS: meal prep, cleaning,  laundry, driving, shopping, community activity, occupation, and yard work  PERSONAL FACTORS Behavior pattern, Fitness, Past/current experiences, Time since onset of injury/illness/exacerbation, and 1-2 comorbidities: insomnia,  are also affecting patient's functional outcome.   REHAB POTENTIAL: Good  CLINICAL DECISION MAKING: Stable/uncomplicated  EVALUATION COMPLEXITY: Low   GOALS: Goals reviewed with patient? Yes  SHORT TERM GOALS: Target date: 09/10/2022   Patient will be independent with initial HEP  Baseline: na Goal status: MET  2.  Pain report to be no greater than 4/10  Baseline: 6/10  Goal  status: in progress (on 11/07/22 reports pain no greater than 5.5/10)  3.  Patient to be able to fall asleep no later than 10pm and sleep consecutive 4 hours Baseline: na Goal status: MET on 09/12/2022 with pt reporting that she can sleep for 5 hours, pain no longer waking her up at night   LONG TERM GOALS: Target date: 11/29/2022  Patient to be independent with advanced HEP  Baseline: na Goal status: IN PROGRESS  2.  Patient to report pain no greater than 2/10  Baseline: na Goal status: INITIAL  3.  Patient to be able to sleep through the night  Baseline: na Goal status: MET on 09/30/2022  4.  Patient to begin and maintain a consistent exercise regimen Baseline: na Goal status: IN PROGRESS  5.  Cervical ROM to be [facility] Baseline: na Goal status: MET on 10/07/2022  6.  UE and postural strength to be 4+/5 Baseline: na Goal status: IN PROGRESS   PLAN: PT FREQUENCY: 1-2x/week  PT DURATION: 8 weeks  PLANNED INTERVENTIONS: Therapeutic exercises, Therapeutic activity, Neuromuscular re-education, Balance training, Patient/Family education, Self Care, Joint mobilization, Aquatic Therapy, Dry Needling, Electrical stimulation, Spinal mobilization, Cryotherapy, Moist heat, scar mobilization, Taping, Traction, Ultrasound, Ionotophoresis 4mg /ml Dexamethasone, Manual therapy, and Re-evaluation  PLAN FOR NEXT SESSION: Manual/Dry needling as indicated, possible use of traction, flexibility, progress postural strength   Akasia Ahmad B. Redonna Wilbert, PT 11/14/22 1:27 PM   [facility] [address], [place] [address], [place] [address] Phone # [phone] Fax [phone]

## 2022-11-18 ENCOUNTER — Ambulatory Visit: Payer: Medicare Other | Admitting: Rehabilitative and Restorative Service Providers"

## 2022-11-18 ENCOUNTER — Encounter: Payer: Self-pay | Admitting: Rehabilitative and Restorative Service Providers"

## 2022-11-18 DIAGNOSIS — M542 Cervicalgia: Secondary | ICD-10-CM

## 2022-11-18 DIAGNOSIS — M6281 Muscle weakness (generalized): Secondary | ICD-10-CM

## 2022-11-18 DIAGNOSIS — R252 Cramp and spasm: Secondary | ICD-10-CM | POA: Diagnosis not present

## 2022-11-18 DIAGNOSIS — R293 Abnormal posture: Secondary | ICD-10-CM

## 2022-11-18 NOTE — Therapy (Signed)
OUTPATIENT PHYSICAL THERAPY TREATMENT NOTE    Patient Name: Charlene Hunt MRN: [id number] DOB:10-18-1951, 72 y.o., female Today's Date: 11/18/2022     PT End of Session - 11/18/22 1149     Visit Number 18    Date for PT Re-Evaluation 11/29/22    Authorization Type [employer]IC[employer]    Progress Note Due on Visit 31    PT Start Time 1146    PT Stop Time 1225    PT Time Calculation (min) 39 min    Activity Tolerance Patient tolerated treatment well    Behavior During Therapy WFL for tasks assessed/performed              Past Medical History:  Diagnosis Date   Abnormal finding on thyroid function test    Acid reflux    ADD (attention deficit disorder)    Allergies    Anxiety    Asymptomatic menopausal state    Current every day vaping    Depression    Diverticulosis    Elevated coronary artery calcium score    Endometrial disorder    Erythema nodosum    Family history of breast cancer    Flat feet    GERD (gastroesophageal reflux disease)    H/O menorrhagia    Herpesviral infection of urogenital system    Hypothyroidism    Insomnia    Memory impairment    Migraine    OSA (obstructive sleep apnea)    Osteopenia    Plantar fasciitis    Sleep disorder    Vitamin D deficiency    Past Surgical History:  Procedure Laterality Date   C SECTIONS     CATARACTS     DILATION AND CURETTAGE OF UTERUS     ENDOMETRIAL ABLATION     NASAL SEPTOPLASTY FOR DEVIATED SEPTUM     TONSILLECTOMY     TORN RETINA     [place]     Patient Active Problem List   Diagnosis Date Noted   Cervicalgia 08/13/2022    PCP: Glenis Smoker, MD  REFERRING PROVIDER: Inez Catalina, MD   REFERRING DIAG: M54.2 (ICD-10-CM) - Cervicalgia   THERAPY DIAG:  Cervicalgia  Cramp and spasm  Abnormal posture  Muscle weakness (generalized)  Rationale for Evaluation and Treatment Rehabilitation  ONSET DATE: 08/08/2022   SUBJECTIVE:  SUBJECTIVE STATEMENT:  Pt reports that she is feeling better overall, but states concern with stopping PT prior to her trip out of country.  PERTINENT HISTORY:  Sleep apnea, insomnia, excessive stress  PAIN:  Are you having pain? Yes: NPRS scale: 3/10 Pain location: left side of neck and upper trap Pain description: aching Aggravating factors: nothing in particular Relieving factors: Tylenol, heat  PRECAUTIONS: None  WEIGHT BEARING RESTRICTIONS No  FALLS:  Has patient fallen in last 6 months? Yes. Number of falls was carrying her grandson up some steps and he was writhing around.   OCCUPATION: retired Agricultural engineer  PLOF: Independent, Independent with basic ADLs, Independent with household mobility without device, Independent with community mobility without device, Independent with homemaking with ambulation, Independent with gait, and Independent with transfers  PATIENT GOALS   Less pain  OBJECTIVE:   DIAGNOSTIC FINDINGS:  na  PATIENT SURVEYS:  Eval:  FOTO 56 (goal is 60) 09/30/2022:  FOTO 61%   COGNITION: Overall cognitive status: Within functional limits for tasks assessed   SENSATION: WFL Patient reports history of some symptoms associated with carpal tunnel bilaterally and left thumb trigger finger,  also hx of mild left shoulder injury in yoga.   POSTURE: rounded shoulders and forward head slight kyphosis  PALPATION: Tender, tight bands, trigger points bilateral upper traps, parascapular areas Left > right    CERVICAL ROM:   Active ROM A/PROM (deg) eval A/ROM (Deg) 09/09/22 A/ROM (Deg) 10/07/22  Flexion WNL 65 65  Extension 50 50 55  Right lateral flexion 32 35 40  Left lateral flexion 30 35 38  Right rotation 42 55 60  Left rotation 45 55 60   (Blank rows = not  tested)  UPPER EXTREMITY ROM:  WNL  UPPER EXTREMITY MMT:  Eval:  Left shoulder ER and scaption with IR both 3+/5,  all others generally 4+/5  CERVICAL SPECIAL TESTS:  Eval:  Spurling's test: Negative   FUNCTIONAL TESTS:  Eval:  5 times sit to stand : 14.06 sec  10/07/2022: 5 times sit to stand:  8.24 sec  TODAY'S TREATMENT:   11/18/2022: UBE level 1.2 x3 min each direction with PT present to discuss status Fwd Bent with 2#:  horizontal abduction, rows, extension.  2x10 each bilat Seated upper trap and levator stretch 2x20 sec bilat Seated across body/delt stretch 2x20 sec bilat Supine with 2#:  serratus punch, CW and CCW circles x20 each Manual Therapy:  Addaday in prone to bilateral upper traps, lower cervical/thoracic paraspinals, parascapular muscles.   11/14/2022: UBE level 1.2 x3 min each direction with PT present to discuss status Reviewed all of HEP: modified prone scapular stabilization exercises to fwd bent  Trigger Point Dry-Needling  Treatment instructions: Expect mild to moderate muscle soreness. S/S of pneumothorax if dry needled over a lung field, and to seek immediate medical attention should they occur. Patient verbalized understanding of these instructions and education.  Patient Consent Given: Yes Education handout provided: Yes Muscles treated: bilateral upper traps, right rhomboids and parascapular areas Electrical stimulation performed: No Parameters: N/A Treatment response/outcome: PT used manual palpation to identify trigger points and tight bands in and about the about muscle groups above, dry needling administered to these areas, twitch response noted in right rhomboids and parascapular areas  along with resulting muscle elongation.  Patient reported relief of pain and minimal soreness post treatment.    11/07/2022: UBE level 1.2 x3 min each direction with PT present to discuss status Standing rows and shoulder  extension with green tband 2x10  each Upper trap and levator stretch 2x20 sec Standing 3 way scapular stabilization on wall with blue loop 2x5 bilat Seated shoulder ER and horizontal abduction with red tband 2x10 Seated 3 way shoulder elevation with 2# 2x10 bilat Supine serratus punch 3# 2x10 bilateral Sidelying ER with 2# 2x10 bilat   11/05/2021: UBE level 1.0 x3 min each direction with PT present to discuss status Trigger Point Dry-Needling  Treatment instructions: Expect mild to moderate muscle soreness. S/S of pneumothorax if dry needled over a lung field, and to seek immediate medical attention should they occur. Patient verbalized understanding of these instructions and education.  Patient Consent Given: Yes Education handout provided: Yes Muscles treated: right upper trap, right rhomboids and parascapular areas Electrical stimulation performed: No Parameters: N/A Treatment response/outcome: PT used manual palpation to identify trigger points and tight bands in and about the about muscle groups above, dry needling administered to these areas, twitch response noted in right rhomboids and parascapular areas  along with resulting muscle elongation.  Patient reported relief of pain and minimal soreness post treatment.       PATIENT EDUCATION:  Education details: Initiated HEP, educated on dry needling and provided handout Person educated: Patient Education method: Consulting civil engineer, Media planner, Verbal cues, and Handouts Education comprehension: verbalized understanding, returned demonstration, and verbal cues required  HOME EXERCISE PROGRAM: Access Code: [email or web address] URL: [email or web address] Date: 09/30/2022 Prepared by: Shelby Dubin Hermann Dottavio  Exercises - Seated Upper Trapezius Stretch  - 1 x daily - 7 x weekly - 1 sets - 2 reps - 20 sec hold - Seated Levator Scapulae Stretch  - 1 x daily - 7 x weekly - 1 sets - 2 reps - 20 sec hold - Seated Scapular Retraction  - 1 x daily - 7 x weekly - 3 sets - 10 reps -  Shoulder Rolls in Sitting  - 1 x daily - 7 x weekly - 3 sets - 10 reps - Seated Cervical Retraction  - 1 x daily - 7 x weekly - 3 sets - 10 reps - Seated Assisted Cervical Rotation with Towel  - 1 x daily - 7 x weekly - 2 sets - 10 reps - Shoulder extension with resistance - Neutral  - 1 x daily - 7 x weekly - 3 sets - 10 reps - Standing Shoulder Row with Anchored Resistance  - 1 x daily - 7 x weekly - 3 sets - 10 reps - Seated Shoulder Horizontal Abduction with Resistance  - 1 x daily - 7 x weekly - 3 sets - 10 reps - Shoulder External Rotation and Scapular Retraction with Resistance  - 1 x daily - 7 x weekly - 2 sets - 10 reps - Prone Shoulder Extension - Single Arm  - 2 x daily - 7 x weekly - 2 sets - 10 reps - Prone Shoulder Row  - 2 x daily - 7 x weekly - 2 sets - 10 reps - Prone Single Arm Shoulder Horizontal Abduction with Scapular Retraction and Palm Down  - 2 x daily - 7 x weekly - 2 sets - 10 reps - Sidelying Shoulder External Rotation  - 2 x daily - 7 x weekly - 2 sets - 10 reps - Single Arm Serratus Punches in Supine with Dumbbell  - 2 x daily - 7 x weekly - 2 sets - 10 reps  ASSESSMENT:  CLINICAL IMPRESSION: Ms Riling presents to skilled PT to progress towards goal related activities reporting overall decreased  pain.  Opted to dry addaday for manual therapy today instead of dry needling to assess pts progress without dry needling, as she is nearing end of current plan of care and has her upcoming trip.  Patient did report decreased pain following Addaday.  Patient requires min cuing during exercises for pacing and technique.  Patient continues to require skilled PT to progress towards goal related activities.   OBJECTIVE IMPAIRMENTS decreased cognition, decreased ROM, decreased strength, increased fascial restrictions, increased muscle spasms, impaired flexibility, postural dysfunction, and pain.   ACTIVITY LIMITATIONS carrying, lifting, sleeping, transfers, bed mobility, dressing,  reach over head, and hygiene/grooming  PARTICIPATION LIMITATIONS: meal prep, cleaning, laundry, driving, shopping, community activity, occupation, and yard work  PERSONAL FACTORS Behavior pattern, Fitness, Past/current experiences, Time since onset of injury/illness/exacerbation, and 1-2 comorbidities: insomnia,  are also affecting patient's functional outcome.   REHAB POTENTIAL: Good  CLINICAL DECISION MAKING: Stable/uncomplicated  EVALUATION COMPLEXITY: Low   GOALS: Goals reviewed with patient? Yes  SHORT TERM GOALS: Target date: 09/10/2022   Patient will be independent with initial HEP  Baseline: na Goal status: MET  2.  Pain report to be no greater than 4/10  Baseline: 6/10  Goal status: in progress (on 11/07/22 reports pain no greater than 5.5/10)  3.  Patient to be able to fall asleep no later than 10pm and sleep consecutive 4 hours Baseline: na Goal status: MET on 09/12/2022 with pt reporting that she can sleep for 5 hours, pain no longer waking her up at night   LONG TERM GOALS: Target date: 11/29/2022  Patient to be independent with advanced HEP  Baseline: na Goal status: IN PROGRESS  2.  Patient to report pain no greater than 2/10  Baseline: na Goal status: INITIAL  3.  Patient to be able to sleep through the night  Baseline: na Goal status: MET on 09/30/2022  4.  Patient to begin and maintain a consistent exercise regimen Baseline: na Goal status: IN PROGRESS  5.  Cervical ROM to be [facility] Baseline: na Goal status: MET on 10/07/2022  6.  UE and postural strength to be 4+/5 Baseline: na Goal status: IN PROGRESS   PLAN: PT FREQUENCY: 1-2x/week  PT DURATION: 8 weeks  PLANNED INTERVENTIONS: Therapeutic exercises, Therapeutic activity, Neuromuscular re-education, Balance training, Patient/Family education, Self Care, Joint mobilization, Aquatic Therapy, Dry Needling, Electrical stimulation, Spinal mobilization, Cryotherapy, Moist heat, scar mobilization,  Taping, Traction, Ultrasound, Ionotophoresis 4mg /ml Dexamethasone, Manual therapy, and Re-evaluation  PLAN FOR NEXT SESSION: Manual/Dry needling as indicated, possible use of traction, flexibility, progress postural strength   , PT 11/18/22 1:21 PM   [facility] [address] [place], [place] Phone # [phone] Fax [phone]

## 2022-11-21 ENCOUNTER — Ambulatory Visit: Payer: Medicare Other

## 2022-11-21 DIAGNOSIS — M542 Cervicalgia: Secondary | ICD-10-CM

## 2022-11-21 DIAGNOSIS — R252 Cramp and spasm: Secondary | ICD-10-CM | POA: Diagnosis not present

## 2022-11-21 DIAGNOSIS — M6281 Muscle weakness (generalized): Secondary | ICD-10-CM

## 2022-11-21 DIAGNOSIS — R293 Abnormal posture: Secondary | ICD-10-CM | POA: Diagnosis not present

## 2022-11-21 NOTE — Therapy (Signed)
OUTPATIENT PHYSICAL THERAPY TREATMENT NOTE    Patient Name: Charlene Hunt MRN: [id number] DOB:April 08, 1951, 72 y.o., female Today's Date: 11/21/2022     PT End of Session - 11/21/22 1155     Visit Number 71    Date for PT Re-Evaluation 11/29/22    Authorization Type [employer]ICARE    Progress Note Due on Visit 41    PT Start Time 1145    PT Stop Time 1230    PT Time Calculation (min) 45 min    Activity Tolerance Patient tolerated treatment well    Behavior During Therapy WFL for tasks assessed/performed              Past Medical History:  Diagnosis Date   Abnormal finding on thyroid function test    Acid reflux    ADD (attention deficit disorder)    Allergies    Anxiety    Asymptomatic menopausal state    Current every day vaping    Depression    Diverticulosis    Elevated coronary artery calcium score    Endometrial disorder    Erythema nodosum    Family history of breast cancer    Flat feet    GERD (gastroesophageal reflux disease)    H/O menorrhagia    Herpesviral infection of urogenital system    Hypothyroidism    Insomnia    Memory impairment    Migraine    OSA (obstructive sleep apnea)    Osteopenia    Plantar fasciitis    Sleep disorder    Vitamin D deficiency    Past Surgical History:  Procedure Laterality Date   C SECTIONS     CATARACTS     DILATION AND CURETTAGE OF UTERUS     ENDOMETRIAL ABLATION     NASAL SEPTOPLASTY FOR DEVIATED SEPTUM     TONSILLECTOMY     TORN RETINA     [place]     Patient Active Problem List   Diagnosis Date Noted   Cervicalgia 08/13/2022    PCP: Glenis Smoker, MD  REFERRING PROVIDER: Inez Catalina, MD   REFERRING DIAG: M54.2 (ICD-10-CM) - Cervicalgia   THERAPY DIAG:  Cervicalgia  Cramp and spasm  Abnormal posture  Muscle weakness (generalized)  Rationale for Evaluation and Treatment Rehabilitation  ONSET DATE: 08/08/2022   SUBJECTIVE:  SUBJECTIVE STATEMENT:  Pt reports "I'm just kind of sore all over".  PERTINENT HISTORY:  Sleep apnea, insomnia, excessive stress  PAIN:  Are you having pain? Yes: NPRS scale: 3/10 Pain location: left side of neck and upper trap Pain description: aching Aggravating factors: nothing in particular Relieving factors: Tylenol, heat  PRECAUTIONS: None  WEIGHT BEARING RESTRICTIONS No  FALLS:  Has patient fallen in last 6 months? Yes. Number of falls was carrying her grandson up some steps and he was writhing around.   OCCUPATION: retired Agricultural engineer  PLOF: Independent, Independent with basic ADLs, Independent with household mobility without device, Independent with community mobility without device, Independent with homemaking with ambulation, Independent with gait, and Independent with transfers  PATIENT GOALS   Less pain  OBJECTIVE:   DIAGNOSTIC FINDINGS:  na  PATIENT SURVEYS:  Eval:  FOTO 56 (goal is 60) 09/30/2022:  FOTO 61%   COGNITION: Overall cognitive status: Within functional limits for tasks assessed   SENSATION: WFL Patient reports history of some symptoms associated with carpal tunnel bilaterally and left thumb trigger finger,  also hx of mild left shoulder injury in yoga.   POSTURE: rounded shoulders and forward head slight kyphosis  PALPATION: Tender, tight bands, trigger points bilateral upper traps, parascapular areas Left > right    CERVICAL ROM:   Active ROM A/PROM (deg) eval A/ROM (Deg) 09/09/22 A/ROM (Deg) 10/07/22  Flexion WNL 65 65  Extension 50 50 55  Right lateral flexion 32 35 40  Left lateral flexion 30 35 38  Right rotation 42 55 60  Left rotation 45 55 60   (Blank rows = not tested)  UPPER EXTREMITY ROM:  WNL  UPPER EXTREMITY MMT:  Eval:  Left  shoulder ER and scaption with IR both 3+/5,  all others generally 4+/5  CERVICAL SPECIAL TESTS:  Eval:  Spurling's test: Negative   FUNCTIONAL TESTS:  Eval:  5 times sit to stand : 14.06 sec  10/07/2022: 5 times sit to stand:  8.24 sec  TODAY'S TREATMENT:  11/21/2022: NuStep x 11 min while discussing goals and status with PT present Standing scapular retraction and cervical retraction using blue foam roller against the wall x 10 Seated thoracic extension with blue foam roller x 10 Standing facing wall, lower trap lift offs 2 x 10 UE band walks on back wall: 2 laps from finger ladder to steps with green loop Trigger Point Dry-Needling  Treatment instructions: Expect mild to moderate muscle soreness. S/S of pneumothorax if dry needled over a lung field, and to seek immediate medical attention should they occur. Patient verbalized understanding of these instructions and education.  Patient Consent Given: Yes Education handout provided: Yes Muscles treated: bilateral upper traps, right rhomboids and parascapular areas Electrical stimulation performed: No Parameters: N/A Treatment response/outcome: PT used manual palpation to identify trigger points and tight bands in and about the about muscle groups above, dry needling administered to these areas, twitch response noted in right rhomboids and parascapular areas  along with resulting muscle elongation.  Patient reported relief of pain and minimal soreness post treatment.     11/18/2022: UBE level 1.2 x3 min each direction with PT present to discuss status Fwd Bent with 2#:  horizontal abduction, rows, extension.  2x10 each bilat Seated upper trap and levator stretch 2x20 sec bilat Seated across body/delt stretch 2x20 sec bilat Supine with 2#:  serratus punch, CW and CCW circles x20 each Manual Therapy:  Addaday in prone to bilateral upper traps, lower  cervical/thoracic paraspinals, parascapular muscles.   11/14/2022: UBE level 1.2 x3  min each direction with PT present to discuss status Reviewed all of HEP: modified prone scapular stabilization exercises to fwd bent  Trigger Point Dry-Needling  Treatment instructions: Expect mild to moderate muscle soreness. S/S of pneumothorax if dry needled over a lung field, and to seek immediate medical attention should they occur. Patient verbalized understanding of these instructions and education.  Patient Consent Given: Yes Education handout provided: Yes Muscles treated: bilateral upper traps, right rhomboids and parascapular areas Electrical stimulation performed: No Parameters: N/A Treatment response/outcome: PT used manual palpation to identify trigger points and tight bands in and about the about muscle groups above, dry needling administered to these areas, twitch response noted in right rhomboids and parascapular areas  along with resulting muscle elongation.  Patient reported relief of pain and minimal soreness post treatment.    11/07/2022: UBE level 1.2 x3 min each direction with PT present to discuss status Standing rows and shoulder extension with green tband 2x10 each Upper trap and levator stretch 2x20 sec Standing 3 way scapular stabilization on wall with blue loop 2x5 bilat Seated shoulder ER and horizontal abduction with red tband 2x10 Seated 3 way shoulder elevation with 2# 2x10 bilat Supine serratus punch 3# 2x10 bilateral Sidelying ER with 2# 2x10 bilat    PATIENT EDUCATION:  Education details: Initiated HEP, educated on dry needling and provided handout Person educated: Patient Education method: Consulting civil engineer, Media planner, Verbal cues, and Handouts Education comprehension: verbalized understanding, returned demonstration, and verbal cues required  HOME EXERCISE PROGRAM: Access Code: [email or web address] URL: [email or web address] Date: 09/30/2022 Prepared by: Shelby Dubin Menke  Exercises - Seated Upper Trapezius Stretch  - 1 x daily - 7 x weekly - 1  sets - 2 reps - 20 sec hold - Seated Levator Scapulae Stretch  - 1 x daily - 7 x weekly - 1 sets - 2 reps - 20 sec hold - Seated Scapular Retraction  - 1 x daily - 7 x weekly - 3 sets - 10 reps - Shoulder Rolls in Sitting  - 1 x daily - 7 x weekly - 3 sets - 10 reps - Seated Cervical Retraction  - 1 x daily - 7 x weekly - 3 sets - 10 reps - Seated Assisted Cervical Rotation with Towel  - 1 x daily - 7 x weekly - 2 sets - 10 reps - Shoulder extension with resistance - Neutral  - 1 x daily - 7 x weekly - 3 sets - 10 reps - Standing Shoulder Row with Anchored Resistance  - 1 x daily - 7 x weekly - 3 sets - 10 reps - Seated Shoulder Horizontal Abduction with Resistance  - 1 x daily - 7 x weekly - 3 sets - 10 reps - Shoulder External Rotation and Scapular Retraction with Resistance  - 1 x daily - 7 x weekly - 2 sets - 10 reps - Prone Shoulder Extension - Single Arm  - 2 x daily - 7 x weekly - 2 sets - 10 reps - Prone Shoulder Row  - 2 x daily - 7 x weekly - 2 sets - 10 reps - Prone Single Arm Shoulder Horizontal Abduction with Scapular Retraction and Palm Down  - 2 x daily - 7 x weekly - 2 sets - 10 reps - Sidelying Shoulder External Rotation  - 2 x daily - 7 x weekly - 2 sets - 10 reps - Single Arm Serratus Punches in Supine with Dumbbell  -  2 x daily - 7 x weekly - 2 sets - 10 reps  ASSESSMENT:  CLINICAL IMPRESSION: Ms Avena is able to do all exercises with minimal pain and fatigue but continues to have generalized pain.  She had good twitch response in left upper trap and reported deep ache in left rhomboids.  Upon completing dry needling, she reported soreness in the left upper trap.  Patient continues to require skilled PT to progress towards goal related activities.   OBJECTIVE IMPAIRMENTS decreased cognition, decreased ROM, decreased strength, increased fascial restrictions, increased muscle spasms, impaired flexibility, postural dysfunction, and pain.   ACTIVITY LIMITATIONS carrying,  lifting, sleeping, transfers, bed mobility, dressing, reach over head, and hygiene/grooming  PARTICIPATION LIMITATIONS: meal prep, cleaning, laundry, driving, shopping, community activity, occupation, and yard work  PERSONAL FACTORS Behavior pattern, Fitness, Past/current experiences, Time since onset of injury/illness/exacerbation, and 1-2 comorbidities: insomnia,  are also affecting patient's functional outcome.   REHAB POTENTIAL: Good  CLINICAL DECISION MAKING: Stable/uncomplicated  EVALUATION COMPLEXITY: Low   GOALS: Goals reviewed with patient? Yes  SHORT TERM GOALS: Target date: 09/10/2022   Patient will be independent with initial HEP  Baseline: na Goal status: MET  2.  Pain report to be no greater than 4/10  Baseline: 6/10  Goal status: in progress (on 11/07/22 reports pain no greater than 5.5/10)  3.  Patient to be able to fall asleep no later than 10pm and sleep consecutive 4 hours Baseline: na Goal status: MET on 09/12/2022 with pt reporting that she can sleep for 5 hours, pain no longer waking her up at night   LONG TERM GOALS: Target date: 11/29/2022  Patient to be independent with advanced HEP  Baseline: na Goal status: IN PROGRESS  2.  Patient to report pain no greater than 2/10  Baseline: na Goal status: INITIAL  3.  Patient to be able to sleep through the night  Baseline: na Goal status: MET on 09/30/2022  4.  Patient to begin and maintain a consistent exercise regimen Baseline: na Goal status: IN PROGRESS  5.  Cervical ROM to be [facility] Baseline: na Goal status: MET on 10/07/2022  6.  UE and postural strength to be 4+/5 Baseline: na Goal status: IN PROGRESS   PLAN: PT FREQUENCY: 1-2x/week  PT DURATION: 8 weeks  PLANNED INTERVENTIONS: Therapeutic exercises, Therapeutic activity, Neuromuscular re-education, Balance training, Patient/Family education, Self Care, Joint mobilization, Aquatic Therapy, Dry Needling, Electrical stimulation, Spinal  mobilization, Cryotherapy, Moist heat, scar mobilization, Taping, Traction, Ultrasound, Ionotophoresis 4mg /ml Dexamethasone, Manual therapy, and Re-evaluation  PLAN FOR NEXT SESSION: Manual/Dry needling as indicated, flexibility, progress postural strength.  Patients 20th visit next, will likely go ahead and recert at same visit and extend POC to cover while she is on vacation and until she returns to be sure her traveling did not cause and exacerbation of her symptoms.     B. Jermal Dismuke, PT 11/21/22 1:30 PM   [facility] [address] [place], [place] Phone # [phone] Fax [phone]

## 2022-11-25 ENCOUNTER — Ambulatory Visit: Payer: Medicare Other | Admitting: Rehabilitative and Restorative Service Providers"

## 2022-11-25 ENCOUNTER — Encounter: Payer: Self-pay | Admitting: Rehabilitative and Restorative Service Providers"

## 2022-11-25 DIAGNOSIS — M6281 Muscle weakness (generalized): Secondary | ICD-10-CM | POA: Diagnosis not present

## 2022-11-25 DIAGNOSIS — R252 Cramp and spasm: Secondary | ICD-10-CM

## 2022-11-25 DIAGNOSIS — R293 Abnormal posture: Secondary | ICD-10-CM

## 2022-11-25 DIAGNOSIS — M542 Cervicalgia: Secondary | ICD-10-CM

## 2022-11-25 NOTE — Therapy (Signed)
OUTPATIENT PHYSICAL THERAPY TREATMENT NOTE AND REASSESSMENT NOTE   Patient Name: Charlene Hunt MRN: [id number] DOB:16-Jul-1951, 73 y.o., female Today's Date: 11/25/2022  Progress Note Reporting Period 10/08/2023 to 11/25/2022  See note below for Objective Data and Assessment of Progress/Goals.       PT End of Session - 11/25/22 1147     Visit Number 20    Date for PT Re-Evaluation 01/24/23    Authorization Type [employer]    Progress Note Due on Visit 11    PT Start Time 1144    PT Stop Time 1225    PT Time Calculation (min) 41 min    Activity Tolerance Patient tolerated treatment well    Behavior During Therapy WFL for tasks assessed/performed              Past Medical History:  Diagnosis Date   Abnormal finding on thyroid function test    Acid reflux    ADD (attention deficit disorder)    Allergies    Anxiety    Asymptomatic menopausal state    Current every day vaping    Depression    Diverticulosis    Elevated coronary artery calcium score    Endometrial disorder    Erythema nodosum    Family history of breast cancer    Flat feet    GERD (gastroesophageal reflux disease)    H/O menorrhagia    Herpesviral infection of urogenital system    Hypothyroidism    Insomnia    Memory impairment    Migraine    OSA (obstructive sleep apnea)    Osteopenia    Plantar fasciitis    Sleep disorder    Vitamin D deficiency    Past Surgical History:  Procedure Laterality Date   C SECTIONS     CATARACTS     DILATION AND CURETTAGE OF UTERUS     ENDOMETRIAL ABLATION     NASAL SEPTOPLASTY FOR DEVIATED SEPTUM     TONSILLECTOMY     TORN RETINA     Mio     Patient Active Problem List   Diagnosis Date Noted   Cervicalgia 08/13/2022    PCP: Glenis Smoker, MD  REFERRING PROVIDER: Inez Catalina, MD   REFERRING DIAG: M54.2 (ICD-10-CM) - Cervicalgia   THERAPY DIAG:  Cervicalgia  Cramp and spasm  Abnormal  posture  Muscle weakness (generalized)  Rationale for Evaluation and Treatment Rehabilitation  ONSET DATE: 08/08/2022   SUBJECTIVE:  SUBJECTIVE STATEMENT:  Pt reports that she has not had to take any Tylenol this morning.  PERTINENT HISTORY:  Sleep apnea, insomnia, excessive stress  PAIN:  Are you having pain? Yes: NPRS scale: 3-4/10 Pain location: left side of neck and upper trap Pain description: aching Aggravating factors: nothing in particular Relieving factors: Tylenol, heat  PRECAUTIONS: None  WEIGHT BEARING RESTRICTIONS No  FALLS:  Has patient fallen in last 6 months? Yes. Number of falls was carrying her grandson up some steps and he was writhing around.   OCCUPATION: retired Agricultural engineer  PLOF: Independent, Independent with basic ADLs, Independent with household mobility without device, Independent with community mobility without device, Independent with homemaking with ambulation, Independent with gait, and Independent with transfers  PATIENT GOALS   Less pain  OBJECTIVE:   DIAGNOSTIC FINDINGS:  na  PATIENT SURVEYS:  Eval:  FOTO 56 (goal is 60) 09/30/2022:  FOTO 61%   COGNITION: Overall cognitive status: Within functional limits for tasks assessed   SENSATION: WFL Patient reports history of some symptoms associated with carpal tunnel bilaterally and left thumb trigger finger,  also hx of mild left shoulder injury in yoga.   POSTURE: rounded shoulders and forward head slight kyphosis  PALPATION: Tender, tight bands, trigger points bilateral upper traps, parascapular areas Left > right    CERVICAL ROM:   Active ROM A/PROM (deg) eval A/ROM (Deg) 09/09/22 A/ROM (Deg) 10/07/22 A/ROM (deg) 11/25/22  Flexion WNL 65 65 70  Extension 50 50 55 60   Right lateral flexion 32 35 40 40  Left lateral flexion 30 35 38 40  Right rotation 42 55 60 82  Left rotation 45 55 60 60   (Blank rows = not tested)  UPPER EXTREMITY ROM:  WNL  UPPER EXTREMITY MMT:  Eval:  Left shoulder ER and scaption with IR both 3+/5,  all others generally 4+/5 11/25/2022:  Left shoulder ER and scaption with IR both 4-/5,  all others generally 4+/5  CERVICAL SPECIAL TESTS:  Eval:  Spurling's test: Negative   FUNCTIONAL TESTS:  Eval:  5 times sit to stand : 14.06 sec  10/07/2022: 5 times sit to stand:  8.24 sec  TODAY'S TREATMENT:   11/25/2022: UBE level 1.2 x3 min each direction with PT present to discuss status Cervical A/ROM Cervical SNAGs for rotation 2x10 bilat Seated thoracic extension with blue foam roller 2x10 Seated across body/delt stretch 2x20 sec bilat UE band walks on back wall: 2 laps each direction from finger ladder to steps with green loop Standing facing wall, lower trap lift offs 2 x 10 Standing lat pull 25# 2x12 Standing rows with 5# 2x10   11/21/2022: NuStep x 11 min while discussing goals and status with PT present Standing scapular retraction and cervical retraction using blue foam roller against the wall x 10 Seated thoracic extension with blue foam roller x 10 Standing facing wall, lower trap lift offs 2 x 10 UE band walks on back wall: 2 laps from finger ladder to steps with green loop Trigger Point Dry-Needling  Treatment instructions: Expect mild to moderate muscle soreness. S/S of pneumothorax if dry needled over a lung field, and to seek immediate medical attention should they occur. Patient verbalized understanding of these instructions and education.  Patient Consent Given: Yes Education handout provided: Yes Muscles treated: bilateral upper traps, right rhomboids and parascapular areas Electrical stimulation performed: No Parameters: N/A Treatment response/outcome: PT used manual palpation to identify trigger  points and tight bands in and  about the about muscle groups above, dry needling administered to these areas, twitch response noted in right rhomboids and parascapular areas  along with resulting muscle elongation.  Patient reported relief of pain and minimal soreness post treatment.     11/18/2022: UBE level 1.2 x3 min each direction with PT present to discuss status Fwd Bent with 2#:  horizontal abduction, rows, extension.  2x10 each bilat Seated upper trap and levator stretch 2x20 sec bilat Seated across body/delt stretch 2x20 sec bilat Supine with 2#:  serratus punch, CW and CCW circles x20 each Manual Therapy:  Addaday in prone to bilateral upper traps, lower cervical/thoracic paraspinals, parascapular muscles.      PATIENT EDUCATION:  Education details: Initiated HEP, educated on dry needling and provided handout Person educated: Patient Education method: Consulting civil engineer, Media planner, Verbal cues, and Handouts Education comprehension: verbalized understanding, returned demonstration, and verbal cues required  HOME EXERCISE PROGRAM: Access Code: [email or web address] URL: [email or web address] Date: 11/25/2022 Prepared by: Shelby Dubin Tynell Winchell  Exercises - Seated Upper Trapezius Stretch  - 1 x daily - 7 x weekly - 1 sets - 2 reps - 20 sec hold - Seated Levator Scapulae Stretch  - 1 x daily - 7 x weekly - 1 sets - 2 reps - 20 sec hold - Seated Scapular Retraction  - 1 x daily - 7 x weekly - 3 sets - 10 reps - Shoulder Rolls in Sitting  - 1 x daily - 7 x weekly - 3 sets - 10 reps - Seated Cervical Retraction  - 1 x daily - 7 x weekly - 3 sets - 10 reps - Seated Assisted Cervical Rotation with Towel  - 1 x daily - 7 x weekly - 2 sets - 10 reps - Shoulder extension with resistance - Neutral  - 1 x daily - 7 x weekly - 3 sets - 10 reps - Standing Shoulder Row with Anchored Resistance  - 1 x daily - 7 x weekly - 3 sets - 10 reps - Seated Shoulder Horizontal Abduction with Resistance  - 1 x  daily - 7 x weekly - 3 sets - 10 reps - Shoulder External Rotation and Scapular Retraction with Resistance  - 1 x daily - 7 x weekly - 2 sets - 10 reps - Sidelying Shoulder External Rotation  - 2 x daily - 7 x weekly - 2 sets - 10 reps - Single Arm Serratus Punches in Supine with Dumbbell  - 2 x daily - 7 x weekly - 2 sets - 10 reps - Bent Over Single Arm Shoulder Row with Dumbbell  - 1 x daily - 7 x weekly - 2 sets - 10 reps - Single Arm Bent Over Shoulder Extension with Dumbbell  - 1 x daily - 7 x weekly - 2 sets - 10 reps - Single Arm Bent Over Shoulder Horizontal Abduction with Dumbbell - Palm Down  - 1 x daily - 7 x weekly - 2 sets - 10 reps  ASSESSMENT:  CLINICAL IMPRESSION: Ms Mckinniss is reporting that she did not require any Tylenol this AM for pain management.  Patient has a [place] trip coming up in February and is worried that she may have increased pain after.  Patient continues to have increased pain, especially with activities and continues with decreased cervical rotation to left side, compared to right side.  Patient able to utilize weight machines during visit today.  Given patient has not yet met all goals, especially for decreased pain, it is recommended that she  continue 1x/week for 8 additional weeks in new cert period to begin on 12/02/2022 until 01/24/2023.   OBJECTIVE IMPAIRMENTS decreased cognition, decreased ROM, decreased strength, increased fascial restrictions, increased muscle spasms, impaired flexibility, postural dysfunction, and pain.   ACTIVITY LIMITATIONS carrying, lifting, sleeping, transfers, bed mobility, dressing, reach over head, and hygiene/grooming  PARTICIPATION LIMITATIONS: meal prep, cleaning, laundry, driving, shopping, community activity, occupation, and yard work  PERSONAL FACTORS Behavior pattern, Fitness, Past/current experiences, Time since onset of injury/illness/exacerbation, and 1-2 comorbidities: insomnia,  are also affecting patient's  functional outcome.   REHAB POTENTIAL: Good  CLINICAL DECISION MAKING: Stable/uncomplicated  EVALUATION COMPLEXITY: Low   GOALS: Goals reviewed with patient? Yes  SHORT TERM GOALS: Target date: 09/10/2022   Patient will be independent with initial HEP  Baseline: na Goal status: MET  2.  Pain report to be no greater than 4/10  Baseline: 6/10  Goal status: MET on 11/25/2022  3.  Patient to be able to fall asleep no later than 10pm and sleep consecutive 4 hours Baseline: na Goal status: MET on 09/12/2022 with pt reporting that she can sleep for 5 hours, pain no longer waking her up at night   LONG TERM GOALS: Target date: 01/24/2023  Patient to be independent with advanced HEP  Baseline: na Goal status: IN PROGRESS  2.  Patient to report pain no greater than 2/10  Baseline: na Goal status: IN PROGRESS  3.  Patient to be able to sleep through the night  Baseline: na Goal status: MET on 09/30/2022  4.  Patient to begin and maintain a consistent exercise regimen Baseline: na Goal status: IN PROGRESS  5.  Cervical ROM to be [facility] Baseline: na Goal status: MET on 10/07/2022  6.  UE and postural strength to be 4+/5 Baseline: na Goal status: IN PROGRESS   PLAN: PT FREQUENCY: 1x/week  PT DURATION: 8 weeks  PLANNED INTERVENTIONS: Therapeutic exercises, Therapeutic activity, Neuromuscular re-education, Balance training, Patient/Family education, Self Care, Joint mobilization, Aquatic Therapy, Dry Needling, Electrical stimulation, Spinal mobilization, Cryotherapy, Moist heat, scar mobilization, Taping, Traction, Ultrasound, Ionotophoresis 4mg /ml Dexamethasone, Manual therapy, and Re-evaluation  PLAN FOR NEXT SESSION: Manual/Dry needling as indicated, flexibility, progress postural strength.     , PT 11/25/22 1:37 PM   [facility] [address] [place], [place] Phone # [phone] Fax [phone]

## 2022-11-28 ENCOUNTER — Ambulatory Visit: Payer: Medicare Other | Attending: Sports Medicine

## 2022-11-28 DIAGNOSIS — R252 Cramp and spasm: Secondary | ICD-10-CM

## 2022-11-28 DIAGNOSIS — M6281 Muscle weakness (generalized): Secondary | ICD-10-CM | POA: Diagnosis not present

## 2022-11-28 DIAGNOSIS — R293 Abnormal posture: Secondary | ICD-10-CM | POA: Diagnosis not present

## 2022-11-28 DIAGNOSIS — M542 Cervicalgia: Secondary | ICD-10-CM

## 2022-11-28 NOTE — Therapy (Signed)
OUTPATIENT PHYSICAL THERAPY TREATMENT NOTE AND REASSESSMENT NOTE   Patient Name: Charlene Hunt MRN: [id number] DOB:Apr 14, 1951, 72 y.o., female Today's Date: 11/28/2022  Progress Note Reporting Period 10/08/2023 to 11/25/2022  See note below for Objective Data and Assessment of Progress/Goals.       PT End of Session - 11/28/22 1150     Visit Number 21    Date for PT Re-Evaluation 01/24/23    Authorization Type [employer]IC[employer]    Progress Note Due on Visit 30    PT Start Time 1150    PT Stop Time 1230    PT Time Calculation (min) 40 min    Activity Tolerance Patient tolerated treatment well    Behavior During Therapy WFL for tasks assessed/performed              Past Medical History:  Diagnosis Date   Abnormal finding on thyroid function test    Acid reflux    ADD (attention deficit disorder)    Allergies    Anxiety    Asymptomatic menopausal state    Current every day vaping    Depression    Diverticulosis    Elevated coronary artery calcium score    Endometrial disorder    Erythema nodosum    Family history of breast cancer    Flat feet    GERD (gastroesophageal reflux disease)    H/O menorrhagia    Herpesviral infection of urogenital system    Hypothyroidism    Insomnia    Memory impairment    Migraine    OSA (obstructive sleep apnea)    Osteopenia    Plantar fasciitis    Sleep disorder    Vitamin D deficiency    Past Surgical History:  Procedure Laterality Date   C SECTIONS     CATARACTS     DILATION AND CURETTAGE OF UTERUS     ENDOMETRIAL ABLATION     NASAL SEPTOPLASTY FOR DEVIATED SEPTUM     TONSILLECTOMY     TORN RETINA     WISDOMTEETH     Patient Active Problem List   Diagnosis Date Noted   Cervicalgia 08/13/2022    PCP: Shon Hale, MD  REFERRING PROVIDER: Christena Deem, MD   REFERRING DIAG: M54.2 (ICD-10-CM) - Cervicalgia   THERAPY DIAG:  Cervicalgia  Cramp and spasm  Abnormal  posture  Muscle weakness (generalized)  Rationale for Evaluation and Treatment Rehabilitation  ONSET DATE: 08/08/2022   SUBJECTIVE:  SUBJECTIVE STATEMENT:  Pt reports that she has not had to take any Tylenol this morning.  PERTINENT HISTORY:  Sleep apnea, insomnia, excessive stress  PAIN:  Are you having pain? Yes: NPRS scale: 3-4/10 Pain location: left side of neck and upper trap Pain description: aching Aggravating factors: nothing in particular Relieving factors: Tylenol, heat  PRECAUTIONS: None  WEIGHT BEARING RESTRICTIONS No  FALLS:  Has patient fallen in last 6 months? Yes. Number of falls was carrying her grandson up some steps and he was writhing around.   OCCUPATION: retired Agricultural engineer  PLOF: Independent, Independent with basic ADLs, Independent with household mobility without device, Independent with community mobility without device, Independent with homemaking with ambulation, Independent with gait, and Independent with transfers  PATIENT GOALS   Less pain  OBJECTIVE:   DIAGNOSTIC FINDINGS:  na  PATIENT SURVEYS:  Eval:  FOTO 56 (goal is 60) 09/30/2022:  FOTO 61%   COGNITION: Overall cognitive status: Within functional limits for tasks assessed   SENSATION: WFL Patient reports history of some symptoms associated with carpal tunnel bilaterally and left thumb trigger finger,  also hx of mild left shoulder injury in yoga.   POSTURE: rounded shoulders and forward head slight kyphosis  PALPATION: Tender, tight bands, trigger points bilateral upper traps, parascapular areas Left > right    CERVICAL ROM:   Active ROM A/PROM (deg) eval A/ROM (Deg) 09/09/22 A/ROM (Deg) 10/07/22 A/ROM (deg) 11/25/22  Flexion WNL 65 65 70  Extension 50 50 55 60   Right lateral flexion 32 35 40 40  Left lateral flexion 30 35 38 40  Right rotation 42 55 60 82  Left rotation 45 55 60 60   (Blank rows = not tested)  UPPER EXTREMITY ROM:  WNL  UPPER EXTREMITY MMT:  Eval:  Left shoulder ER and scaption with IR both 3+/5,  all others generally 4+/5 11/25/2022:  Left shoulder ER and scaption with IR both 4-/5,  all others generally 4+/5  CERVICAL SPECIAL TESTS:  Eval:  Spurling's test: Negative   FUNCTIONAL TESTS:  Eval:  5 times sit to stand : 14.06 sec  10/07/2022: 5 times sit to stand:  8.24 sec  TODAY'S TREATMENT:  11/28/2022: UBE x 5 min while discussing goals and status with PT present TYI's over red physio ball, feet on floor, ball on mat table x10 Seated row (Matrix) 20 lbs x 20 Standing lat pull down 30 Trigger Point Dry-Needling  Treatment instructions: Expect mild to moderate muscle soreness. S/S of pneumothorax if dry needled over a lung field, and to seek immediate medical attention should they occur. Patient verbalized understanding of these instructions and education.  Patient Consent Given: Yes Education handout provided: Yes Muscles treated: bilateral upper traps, right and left rhomboids and parascapular areas Electrical stimulation performed: No Parameters: N/A Treatment response/outcome: PT used manual palpation to identify trigger points and tight bands in and about the about muscle groups above, dry needling administered to these areas, twitch response noted in right rhomboids and parascapular areas  along with resulting muscle elongation.  Patient reported relief of pain and minimal soreness post treatment.     11/25/2022: UBE level 1.2 x3 min each direction with PT present to discuss status Cervical A/ROM Cervical SNAGs for rotation 2x10 bilat Seated thoracic extension with blue foam roller 2x10 Seated across body/delt stretch 2x20 sec bilat UE band walks on back wall: 2 laps each direction from finger ladder to  steps with green loop Standing facing wall, lower  trap lift offs 2 x 10 Standing lat pull 25# 2x12 Standing rows with 5# 2x10   11/21/2022: NuStep x 11 min while discussing goals and status with PT present Standing scapular retraction and cervical retraction using blue foam roller against the wall x 10 Seated thoracic extension with blue foam roller x 10 Standing facing wall, lower trap lift offs 2 x 10 UE band walks on back wall: 2 laps from finger ladder to steps with green loop Trigger Point Dry-Needling  Treatment instructions: Expect mild to moderate muscle soreness. S/S of pneumothorax if dry needled over a lung field, and to seek immediate medical attention should they occur. Patient verbalized understanding of these instructions and education.  Patient Consent Given: Yes Education handout provided: Yes Muscles treated: bilateral upper traps, right rhomboids and parascapular areas Electrical stimulation performed: No Parameters: N/A Treatment response/outcome: PT used manual palpation to identify trigger points and tight bands in and about the about muscle groups above, dry needling administered to these areas, twitch response noted in right rhomboids and parascapular areas  along with resulting muscle elongation.  Patient reported relief of pain and minimal soreness post treatment.      PATIENT EDUCATION:  Education details: Initiated HEP, educated on dry needling and provided handout Person educated: Patient Education method: Consulting civil engineer, Media planner, Verbal cues, and Handouts Education comprehension: verbalized understanding, returned demonstration, and verbal cues required  HOME EXERCISE PROGRAM: Access Code: [email or web address] URL: [email or web address] Date: 11/25/2022 Prepared by: Shelby Dubin Menke  Exercises - Seated Upper Trapezius Stretch  - 1 x daily - 7 x weekly - 1 sets - 2 reps - 20 sec hold - Seated Levator Scapulae Stretch  - 1 x daily - 7 x weekly - 1  sets - 2 reps - 20 sec hold - Seated Scapular Retraction  - 1 x daily - 7 x weekly - 3 sets - 10 reps - Shoulder Rolls in Sitting  - 1 x daily - 7 x weekly - 3 sets - 10 reps - Seated Cervical Retraction  - 1 x daily - 7 x weekly - 3 sets - 10 reps - Seated Assisted Cervical Rotation with Towel  - 1 x daily - 7 x weekly - 2 sets - 10 reps - Shoulder extension with resistance - Neutral  - 1 x daily - 7 x weekly - 3 sets - 10 reps - Standing Shoulder Row with Anchored Resistance  - 1 x daily - 7 x weekly - 3 sets - 10 reps - Seated Shoulder Horizontal Abduction with Resistance  - 1 x daily - 7 x weekly - 3 sets - 10 reps - Shoulder External Rotation and Scapular Retraction with Resistance  - 1 x daily - 7 x weekly - 2 sets - 10 reps - Sidelying Shoulder External Rotation  - 2 x daily - 7 x weekly - 2 sets - 10 reps - Single Arm Serratus Punches in Supine with Dumbbell  - 2 x daily - 7 x weekly - 2 sets - 10 reps - Bent Over Single Arm Shoulder Row with Dumbbell  - 1 x daily - 7 x weekly - 2 sets - 10 reps - Single Arm Bent Over Shoulder Extension with Dumbbell  - 1 x daily - 7 x weekly - 2 sets - 10 reps - Single Arm Bent Over Shoulder Horizontal Abduction with Dumbbell - Palm Down  - 1 x daily - 7 x weekly - 2 sets - 10 reps  ASSESSMENT:  CLINICAL IMPRESSION: Ms  Coover is progressing appropriately.  She is managing her pain well but continues to have shoulder weakness and scapular control weakness.  She does have trigger points in bilateral upper traps with good twitch response at most every visit indicating she continues with compensatory shoulder strategies due to the shoulder weakness.  She would benefit from continuing skilled PT for 1-2 x/week for 8 additional weeks in new cert period to begin on 12/02/2022 until 01/24/2023.   OBJECTIVE IMPAIRMENTS decreased cognition, decreased ROM, decreased strength, increased fascial restrictions, increased muscle spasms, impaired flexibility, postural  dysfunction, and pain.   ACTIVITY LIMITATIONS carrying, lifting, sleeping, transfers, bed mobility, dressing, reach over head, and hygiene/grooming  PARTICIPATION LIMITATIONS: meal prep, cleaning, laundry, driving, shopping, community activity, occupation, and yard work  PERSONAL FACTORS Behavior pattern, Fitness, Past/current experiences, Time since onset of injury/illness/exacerbation, and 1-2 comorbidities: insomnia,  are also affecting patient's functional outcome.   REHAB POTENTIAL: Good  CLINICAL DECISION MAKING: Stable/uncomplicated  EVALUATION COMPLEXITY: Low   GOALS: Goals reviewed with patient? Yes  SHORT TERM GOALS: Target date: 09/10/2022   Patient will be independent with initial HEP  Baseline: na Goal status: MET  2.  Pain report to be no greater than 4/10  Baseline: 6/10  Goal status: MET on 11/25/2022  3.  Patient to be able to fall asleep no later than 10pm and sleep consecutive 4 hours Baseline: na Goal status: MET on 09/12/2022 with pt reporting that she can sleep for 5 hours, pain no longer waking her up at night   LONG TERM GOALS: Target date: 01/24/2023  Patient to be independent with advanced HEP  Baseline: na Goal status: IN PROGRESS  2.  Patient to report pain no greater than 2/10  Baseline: na Goal status: IN PROGRESS  3.  Patient to be able to sleep through the night  Baseline: na Goal status: MET on 09/30/2022  4.  Patient to begin and maintain a consistent exercise regimen Baseline: na Goal status: IN PROGRESS  5.  Cervical ROM to be [facility] Baseline: na Goal status: MET on 10/07/2022  6.  UE and postural strength to be 4+/5 Baseline: na Goal status: IN PROGRESS   PLAN: PT FREQUENCY: 1x/week  PT DURATION: 8 weeks  PLANNED INTERVENTIONS: Therapeutic exercises, Therapeutic activity, Neuromuscular re-education, Balance training, Patient/Family education, Self Care, Joint mobilization, Aquatic Therapy, Dry Needling, Electrical  stimulation, Spinal mobilization, Cryotherapy, Moist heat, scar mobilization, Taping, Traction, Ultrasound, Ionotophoresis 4mg /ml Dexamethasone, Manual therapy, and Re-evaluation  PLAN FOR NEXT SESSION: Manual/Dry needling as indicated, flexibility, progress postural strength.     Anderson Malta B. Yalanda Soderman, PT 11/28/22 1:24 PM  [facility] [address] [place], [place] [address] Phone # [phone] Fax [phone]

## 2022-12-02 ENCOUNTER — Encounter: Payer: Self-pay | Admitting: Rehabilitative and Restorative Service Providers"

## 2022-12-02 ENCOUNTER — Ambulatory Visit: Payer: Medicare Other | Admitting: Rehabilitative and Restorative Service Providers"

## 2022-12-02 DIAGNOSIS — R252 Cramp and spasm: Secondary | ICD-10-CM | POA: Diagnosis not present

## 2022-12-02 DIAGNOSIS — M6281 Muscle weakness (generalized): Secondary | ICD-10-CM | POA: Diagnosis not present

## 2022-12-02 DIAGNOSIS — R293 Abnormal posture: Secondary | ICD-10-CM

## 2022-12-02 DIAGNOSIS — M542 Cervicalgia: Secondary | ICD-10-CM

## 2022-12-02 NOTE — Therapy (Signed)
OUTPATIENT PHYSICAL THERAPY TREATMENT NOTE    Patient Name: Charlene Hunt MRN: [id number] DOB:07-07-1951, 72 y.o., female Today's Date: 12/02/2022      PT End of Session - 12/02/22 1144     Visit Number 22    Date for PT Re-Evaluation 01/24/23    Authorization Type [employer]    Progress Note Due on Visit 30    PT Start Time 1143    PT Stop Time 1225    PT Time Calculation (min) 42 min    Activity Tolerance Patient tolerated treatment well    Behavior During Therapy WFL for tasks assessed/performed              Past Medical History:  Diagnosis Date   Abnormal finding on thyroid function test    Acid reflux    ADD (attention deficit disorder)    Allergies    Anxiety    Asymptomatic menopausal state    Current every day vaping    Depression    Diverticulosis    Elevated coronary artery calcium score    Endometrial disorder    Erythema nodosum    Family history of breast cancer    Flat feet    GERD (gastroesophageal reflux disease)    H/O menorrhagia    Herpesviral infection of urogenital system    Hypothyroidism    Insomnia    Memory impairment    Migraine    OSA (obstructive sleep apnea)    Osteopenia    Plantar fasciitis    Sleep disorder    Vitamin D deficiency    Past Surgical History:  Procedure Laterality Date   C SECTIONS     CATARACTS     DILATION AND CURETTAGE OF UTERUS     ENDOMETRIAL ABLATION     NASAL SEPTOPLASTY FOR DEVIATED SEPTUM     TONSILLECTOMY     TORN RETINA     WISDOMTEETH     Patient Active Problem List   Diagnosis Date Noted   Cervicalgia 08/13/2022    PCP: Shon Hale, MD  REFERRING PROVIDER: Christena Deem, MD   REFERRING DIAG: M54.2 (ICD-10-CM) - Cervicalgia   THERAPY DIAG:  Cervicalgia  Cramp and spasm  Abnormal posture  Muscle weakness (generalized)  Rationale for Evaluation and Treatment Rehabilitation  ONSET DATE: 08/08/2022   SUBJECTIVE:  SUBJECTIVE STATEMENT:  Pt reports feeling that she has some tightness from packing for her trip.  PERTINENT HISTORY:  Sleep apnea, insomnia, excessive stress  PAIN:  Are you having pain? Yes: NPRS scale: 3-4/10 Pain location: left side of neck and upper trap Pain description: aching Aggravating factors: nothing in particular Relieving factors: Tylenol, heat  PRECAUTIONS: None  WEIGHT BEARING RESTRICTIONS No  FALLS:  Has patient fallen in last 6 months? Yes. Number of falls was carrying her grandson up some steps and he was writhing around.   OCCUPATION: retired Agricultural engineer  PLOF: Independent, Independent with basic ADLs, Independent with household mobility without device, Independent with community mobility without device, Independent with homemaking with ambulation, Independent with gait, and Independent with transfers  PATIENT GOALS   Less pain  OBJECTIVE:   DIAGNOSTIC FINDINGS:  na  PATIENT SURVEYS:  Eval:  FOTO 56 (goal is 60) 09/30/2022:  FOTO 61%   COGNITION: Overall cognitive status: Within functional limits for tasks assessed   SENSATION: WFL Patient reports history of some symptoms associated with carpal tunnel bilaterally and left thumb trigger finger,  also hx of mild left shoulder injury in yoga.   POSTURE: rounded shoulders and forward head slight kyphosis  PALPATION: Tender, tight bands, trigger points bilateral upper traps, parascapular areas Left > right    CERVICAL ROM:   Active ROM A/PROM (deg) eval A/ROM (Deg) 09/09/22 A/ROM (Deg) 10/07/22 A/ROM (deg) 11/25/22  Flexion WNL 65 65 70  Extension 50 50 55 60  Right lateral flexion 32 35 40 40  Left lateral flexion 30 35 38 40  Right rotation 42 55 60 82  Left rotation 45 55 60 60   (Blank rows = not  tested)  UPPER EXTREMITY ROM:  WNL  UPPER EXTREMITY MMT:  Eval:  Left shoulder ER and scaption with IR both 3+/5,  all others generally 4+/5 11/25/2022:  Left shoulder ER and scaption with IR both 4-/5,  all others generally 4+/5  CERVICAL SPECIAL TESTS:  Eval:  Spurling's test: Negative   FUNCTIONAL TESTS:  Eval:  5 times sit to stand : 14.06 sec  10/07/2022: 5 times sit to stand:  8.24 sec  TODAY'S TREATMENT:   12/02/2022: UBE level 1.2 x3 min each direction with PT present to discuss status TYI's over red physio ball, feet on floor, ball on mat table with 1# dumbbells.  2x10 Seated rows 25# 2x10 Standing lat pulldown 30# 2x10 UE band walks on back wall: 2 laps each direction from finger ladder to steps with green loop Standing facing wall, lower trap lift offs 2 x 10 Wall pushups 2x10 Ultrasound in prone to cervical and upper trap region:  3.3 MHz, 50% duty cycle, 1.3 W/cm2 for 8 min Seated upper trap and levator stretch 2x20 sec bilat each   11/28/2022: UBE x 5 min while discussing goals and status with PT present TYI's over red physio ball, feet on floor, ball on mat table x10 Seated row (Matrix) 20 lbs x 20 Standing lat pull down 30 Trigger Point Dry-Needling  Treatment instructions: Expect mild to moderate muscle soreness. S/S of pneumothorax if dry needled over a lung field, and to seek immediate medical attention should they occur. Patient verbalized understanding of these instructions and education.  Patient Consent Given: Yes Education handout provided: Yes Muscles treated: bilateral upper traps, right and left rhomboids and parascapular areas Electrical stimulation performed: No Parameters: N/A Treatment response/outcome: PT used manual palpation to identify trigger points and tight bands  in and about the about muscle groups above, dry needling administered to these areas, twitch response noted in right rhomboids and parascapular areas  along with resulting  muscle elongation.  Patient reported relief of pain and minimal soreness post treatment.     11/25/2022: UBE level 1.2 x3 min each direction with PT present to discuss status Cervical A/ROM Cervical SNAGs for rotation 2x10 bilat Seated thoracic extension with blue foam roller 2x10 Seated across body/delt stretch 2x20 sec bilat UE band walks on back wall: 2 laps each direction from finger ladder to steps with green loop Standing facing wall, lower trap lift offs 2 x 10 Standing lat pull 25# 2x12 Standing rows with 5# 2x10     PATIENT EDUCATION:  Education details: Initiated HEP, educated on dry needling and provided handout Person educated: Patient Education method: Consulting civil engineer, Media planner, Verbal cues, and Handouts Education comprehension: verbalized understanding, returned demonstration, and verbal cues required  HOME EXERCISE PROGRAM: Access Code: [email or web address] URL: [email or web address] Date: 11/25/2022 Prepared by: Shelby Dubin Aleem Elza  Exercises - Seated Upper Trapezius Stretch  - 1 x daily - 7 x weekly - 1 sets - 2 reps - 20 sec hold - Seated Levator Scapulae Stretch  - 1 x daily - 7 x weekly - 1 sets - 2 reps - 20 sec hold - Seated Scapular Retraction  - 1 x daily - 7 x weekly - 3 sets - 10 reps - Shoulder Rolls in Sitting  - 1 x daily - 7 x weekly - 3 sets - 10 reps - Seated Cervical Retraction  - 1 x daily - 7 x weekly - 3 sets - 10 reps - Seated Assisted Cervical Rotation with Towel  - 1 x daily - 7 x weekly - 2 sets - 10 reps - Shoulder extension with resistance - Neutral  - 1 x daily - 7 x weekly - 3 sets - 10 reps - Standing Shoulder Row with Anchored Resistance  - 1 x daily - 7 x weekly - 3 sets - 10 reps - Seated Shoulder Horizontal Abduction with Resistance  - 1 x daily - 7 x weekly - 3 sets - 10 reps - Shoulder External Rotation and Scapular Retraction with Resistance  - 1 x daily - 7 x weekly - 2 sets - 10 reps - Sidelying Shoulder External Rotation  - 2  x daily - 7 x weekly - 2 sets - 10 reps - Single Arm Serratus Punches in Supine with Dumbbell  - 2 x daily - 7 x weekly - 2 sets - 10 reps - Bent Over Single Arm Shoulder Row with Dumbbell  - 1 x daily - 7 x weekly - 2 sets - 10 reps - Single Arm Bent Over Shoulder Extension with Dumbbell  - 1 x daily - 7 x weekly - 2 sets - 10 reps - Single Arm Bent Over Shoulder Horizontal Abduction with Dumbbell - Palm Down  - 1 x daily - 7 x weekly - 2 sets - 10 reps  ASSESSMENT:  CLINICAL IMPRESSION: Ms Lanni presents with some increased tightness, but could be secondary to preparations for upcoming trip.  Patient able to progress with exercises.  Opted to utilize ultrasound to assist with tissue looseness and decreased pain.  Patient reported having decreased pain following ultrasound treatment.  Patient reports pain decreased to 1/10 following ultrasound treatment.  Patient continues to progress and will assess the length of benefits from initiating ultrasound treatment next visit.   OBJECTIVE IMPAIRMENTS decreased cognition, decreased  ROM, decreased strength, increased fascial restrictions, increased muscle spasms, impaired flexibility, postural dysfunction, and pain.   ACTIVITY LIMITATIONS carrying, lifting, sleeping, transfers, bed mobility, dressing, reach over head, and hygiene/grooming  PARTICIPATION LIMITATIONS: meal prep, cleaning, laundry, driving, shopping, community activity, occupation, and yard work  PERSONAL FACTORS Behavior pattern, Fitness, Past/current experiences, Time since onset of injury/illness/exacerbation, and 1-2 comorbidities: insomnia,  are also affecting patient's functional outcome.   REHAB POTENTIAL: Good  CLINICAL DECISION MAKING: Stable/uncomplicated  EVALUATION COMPLEXITY: Low   GOALS: Goals reviewed with patient? Yes  SHORT TERM GOALS: Target date: 09/10/2022   Patient will be independent with initial HEP  Baseline: na Goal status: MET  2.  Pain report to  be no greater than 4/10  Baseline: 6/10  Goal status: MET on 11/25/2022  3.  Patient to be able to fall asleep no later than 10pm and sleep consecutive 4 hours Baseline: na Goal status: MET on 09/12/2022 with pt reporting that she can sleep for 5 hours, pain no longer waking her up at night   LONG TERM GOALS: Target date: 01/24/2023  Patient to be independent with advanced HEP  Baseline: na Goal status: IN PROGRESS  2.  Patient to report pain no greater than 2/10  Baseline: na Goal status: IN PROGRESS  3.  Patient to be able to sleep through the night  Baseline: na Goal status: MET on 09/30/2022  4.  Patient to begin and maintain a consistent exercise regimen Baseline: na Goal status: IN PROGRESS  5.  Cervical ROM to be [facility] Baseline: na Goal status: MET on 10/07/2022  6.  UE and postural strength to be 4+/5 Baseline: na Goal status: IN PROGRESS   PLAN: PT FREQUENCY: 1x/week  PT DURATION: 8 weeks  PLANNED INTERVENTIONS: Therapeutic exercises, Therapeutic activity, Neuromuscular re-education, Balance training, Patient/Family education, Self Care, Joint mobilization, Aquatic Therapy, Dry Needling, Electrical stimulation, Spinal mobilization, Cryotherapy, Moist heat, scar mobilization, Taping, Traction, Ultrasound, Ionotophoresis 4mg /ml Dexamethasone, Manual therapy, and Re-evaluation  PLAN FOR NEXT SESSION: Manual/Dry needling as indicated, flexibility, progress postural strength, ultrasound to cervical region if indicated.   Juel Burrow, PT 12/02/22 12:33 PM   [facility] [address], [place], [place] [address] Phone # [phone] Fax [phone]

## 2022-12-09 ENCOUNTER — Encounter: Payer: Self-pay | Admitting: Rehabilitative and Restorative Service Providers"

## 2022-12-09 ENCOUNTER — Ambulatory Visit: Payer: Medicare Other | Admitting: Rehabilitative and Restorative Service Providers"

## 2022-12-09 DIAGNOSIS — M6281 Muscle weakness (generalized): Secondary | ICD-10-CM | POA: Diagnosis not present

## 2022-12-09 DIAGNOSIS — M542 Cervicalgia: Secondary | ICD-10-CM

## 2022-12-09 DIAGNOSIS — R293 Abnormal posture: Secondary | ICD-10-CM

## 2022-12-09 DIAGNOSIS — R252 Cramp and spasm: Secondary | ICD-10-CM | POA: Diagnosis not present

## 2022-12-09 NOTE — Therapy (Signed)
OUTPATIENT PHYSICAL THERAPY TREATMENT NOTE    Patient Name: Charlene Hunt MRN: [id number] DOB:[birth year], 72 y.o., female Today's Date: 12/09/2022      PT End of Session - 12/09/22 1151     Visit Number 23    Date for PT Re-Evaluation 01/24/23    Authorization Type [employer]    Progress Note Due on Visit 78    PT Start Time 1145    PT Stop Time 1225    PT Time Calculation (min) 40 min    Activity Tolerance Patient tolerated treatment well    Behavior During Therapy WFL for tasks assessed/performed              Past Medical History:  Diagnosis Date   Abnormal finding on thyroid function test    Acid reflux    ADD (attention deficit disorder)    Allergies    Anxiety    Asymptomatic menopausal state    Current every day vaping    Depression    Diverticulosis    Elevated coronary artery calcium score    Endometrial disorder    Erythema nodosum    Family history of breast cancer    Flat feet    GERD (gastroesophageal reflux disease)    H/O menorrhagia    Herpesviral infection of urogenital system    Hypothyroidism    Insomnia    Memory impairment    Migraine    OSA (obstructive sleep apnea)    Osteopenia    Plantar fasciitis    Sleep disorder    Vitamin D deficiency    Past Surgical History:  Procedure Laterality Date   C SECTIONS     CATARACTS     DILATION AND CURETTAGE OF UTERUS     ENDOMETRIAL ABLATION     NASAL SEPTOPLASTY FOR DEVIATED SEPTUM     TONSILLECTOMY     TORN RETINA     [place]     Patient Active Problem List   Diagnosis Date Noted   Cervicalgia 08/13/2022    PCP: Glenis Smoker, MD  REFERRING PROVIDER: Inez Catalina, MD   REFERRING DIAG: M54.2 (ICD-10-CM) - Cervicalgia   THERAPY DIAG:  Cervicalgia  Cramp and spasm  Abnormal posture  Muscle weakness (generalized)  Rationale for Evaluation and Treatment Rehabilitation  ONSET DATE: 08/08/2022   SUBJECTIVE:  SUBJECTIVE STATEMENT:  Pt reports feeling some increased pain, but is uncertain if this is due to rainy weather.  PERTINENT HISTORY:  Sleep apnea, insomnia, excessive stress  PAIN:  Are you having pain? Yes: NPRS scale: 5/10 Pain location: left side of neck and upper trap Pain description: aching Aggravating factors: nothing in particular Relieving factors: Tylenol, heat  PRECAUTIONS: None  WEIGHT BEARING RESTRICTIONS No  FALLS:  Has patient fallen in last 6 months? Yes. Number of falls was carrying her grandson up some steps and he was writhing around.   OCCUPATION: retired Agricultural engineer  PLOF: Independent, Independent with basic ADLs, Independent with household mobility without device, Independent with community mobility without device, Independent with homemaking with ambulation, Independent with gait, and Independent with transfers  PATIENT GOALS   Less pain  OBJECTIVE:   DIAGNOSTIC FINDINGS:  na  PATIENT SURVEYS:  Eval:  FOTO 56 (goal is 60) 09/30/2022:  FOTO 61%   COGNITION: Overall cognitive status: Within functional limits for tasks assessed   SENSATION: WFL Patient reports history of some symptoms associated with carpal tunnel bilaterally and left thumb trigger finger,  also hx of mild left shoulder injury in yoga.   POSTURE: rounded shoulders and forward head slight kyphosis  PALPATION: Tender, tight bands, trigger points bilateral upper traps, parascapular areas Left > right    CERVICAL ROM:   Active ROM A/PROM (deg) eval A/ROM (Deg) 09/09/22 A/ROM (Deg) 10/07/22 A/ROM (deg) 11/25/22  Flexion WNL 65 65 70  Extension 50 50 55 60  Right lateral flexion 32 35 40 40  Left lateral flexion 30 35 38 40  Right rotation 42 55 60 82  Left rotation 45 55 60 60   (Blank rows =  not tested)  UPPER EXTREMITY ROM:  WNL  UPPER EXTREMITY MMT:  Eval:  Left shoulder ER and scaption with IR both 3+/5,  all others generally 4+/5 11/25/2022:  Left shoulder ER and scaption with IR both 4-/5,  all others generally 4+/5  CERVICAL SPECIAL TESTS:  Eval:  Spurling's test: Negative   FUNCTIONAL TESTS:  Eval:  5 times sit to stand : 14.06 sec  10/07/2022: 5 times sit to stand:  8.24 sec  TODAY'S TREATMENT:   12/09/2022: UBE level 1.2 x3 min each direction with PT present to discuss status TYI's over red physio ball, feet on floor, ball on mat table with 1# dumbbells.  2x10 Seated rows 25# 2x10 Standing lat pulldown 30# 2x10 UE band walks on back wall: 2 laps each direction from finger ladder to steps with green loop Standing facing wall, lower trap lift offs 2 x 10 Wall pushups 2x10 Ultrasound in prone to cervical and upper trap region:  3.3 MHz, 50% duty cycle, 1.3 W/cm2 for 8 min   12/02/2022: UBE level 1.2 x3 min each direction with PT present to discuss status TYI's over red physio ball, feet on floor, ball on mat table with 1# dumbbells.  2x10 Seated rows 25# 2x10 Standing lat pulldown 30# 2x10 UE band walks on back wall: 2 laps each direction from finger ladder to steps with green loop Standing facing wall, lower trap lift offs 2 x 10 Wall pushups 2x10 Ultrasound in prone to cervical and upper trap region:  3.3 MHz, 50% duty cycle, 1.3 W/cm2 for 8 min Seated upper trap and levator stretch 2x20 sec bilat each   11/28/2022: UBE x 5 min while discussing goals and status with PT present TYI's over red physio ball, feet on floor,  ball on mat table x10 Seated row (Matrix) 20 lbs x 20 Standing lat pull down 30 Trigger Point Dry-Needling  Treatment instructions: Expect mild to moderate muscle soreness. S/S of pneumothorax if dry needled over a lung field, and to seek immediate medical attention should they occur. Patient verbalized understanding of these  instructions and education.  Patient Consent Given: Yes Education handout provided: Yes Muscles treated: bilateral upper traps, right and left rhomboids and parascapular areas Electrical stimulation performed: No Parameters: N/A Treatment response/outcome: PT used manual palpation to identify trigger points and tight bands in and about the about muscle groups above, dry needling administered to these areas, twitch response noted in right rhomboids and parascapular areas  along with resulting muscle elongation.  Patient reported relief of pain and minimal soreness post treatment.       PATIENT EDUCATION:  Education details: Initiated HEP, educated on dry needling and provided handout Person educated: Patient Education method: Consulting civil engineer, Media planner, Verbal cues, and Handouts Education comprehension: verbalized understanding, returned demonstration, and verbal cues required  HOME EXERCISE PROGRAM: Access Code: [email or web address] URL: [email or web address] Date: 11/25/2022 Prepared by: Shelby Dubin Burnell Hurta  Exercises - Seated Upper Trapezius Stretch  - 1 x daily - 7 x weekly - 1 sets - 2 reps - 20 sec hold - Seated Levator Scapulae Stretch  - 1 x daily - 7 x weekly - 1 sets - 2 reps - 20 sec hold - Seated Scapular Retraction  - 1 x daily - 7 x weekly - 3 sets - 10 reps - Shoulder Rolls in Sitting  - 1 x daily - 7 x weekly - 3 sets - 10 reps - Seated Cervical Retraction  - 1 x daily - 7 x weekly - 3 sets - 10 reps - Seated Assisted Cervical Rotation with Towel  - 1 x daily - 7 x weekly - 2 sets - 10 reps - Shoulder extension with resistance - Neutral  - 1 x daily - 7 x weekly - 3 sets - 10 reps - Standing Shoulder Row with Anchored Resistance  - 1 x daily - 7 x weekly - 3 sets - 10 reps - Seated Shoulder Horizontal Abduction with Resistance  - 1 x daily - 7 x weekly - 3 sets - 10 reps - Shoulder External Rotation and Scapular Retraction with Resistance  - 1 x daily - 7 x weekly - 2  sets - 10 reps - Sidelying Shoulder External Rotation  - 2 x daily - 7 x weekly - 2 sets - 10 reps - Single Arm Serratus Punches in Supine with Dumbbell  - 2 x daily - 7 x weekly - 2 sets - 10 reps - Bent Over Single Arm Shoulder Row with Dumbbell  - 1 x daily - 7 x weekly - 2 sets - 10 reps - Single Arm Bent Over Shoulder Extension with Dumbbell  - 1 x daily - 7 x weekly - 2 sets - 10 reps - Single Arm Bent Over Shoulder Horizontal Abduction with Dumbbell - Palm Down  - 1 x daily - 7 x weekly - 2 sets - 10 reps  ASSESSMENT:  CLINICAL IMPRESSION: Ms Ruffcorn presents with some pain, but does state that she had a great relief of her pain for approximately 3 days with ultrasound last week.  Patient is progressing with increased scapular and postural muscle strengthening.  Patient reporting decreased pain towards end of PT session following ultrasound.  Patient will have follow up visit after her trip out of  country this next week.   OBJECTIVE IMPAIRMENTS decreased cognition, decreased ROM, decreased strength, increased fascial restrictions, increased muscle spasms, impaired flexibility, postural dysfunction, and pain.   ACTIVITY LIMITATIONS carrying, lifting, sleeping, transfers, bed mobility, dressing, reach over head, and hygiene/grooming  PARTICIPATION LIMITATIONS: meal prep, cleaning, laundry, driving, shopping, community activity, occupation, and yard work  PERSONAL FACTORS Behavior pattern, Fitness, Past/current experiences, Time since onset of injury/illness/exacerbation, and 1-2 comorbidities: insomnia,  are also affecting patient's functional outcome.   REHAB POTENTIAL: Good  CLINICAL DECISION MAKING: Stable/uncomplicated  EVALUATION COMPLEXITY: Low   GOALS: Goals reviewed with patient? Yes  SHORT TERM GOALS: Target date: 09/10/2022   Patient will be independent with initial HEP  Baseline: na Goal status: MET  2.  Pain report to be no greater than 4/10  Baseline: 6/10   Goal status: MET on 11/25/2022  3.  Patient to be able to fall asleep no later than 10pm and sleep consecutive 4 hours Baseline: na Goal status: MET on 09/12/2022 with pt reporting that she can sleep for 5 hours, pain no longer waking her up at night   LONG TERM GOALS: Target date: 01/24/2023  Patient to be independent with advanced HEP  Baseline: na Goal status: IN PROGRESS  2.  Patient to report pain no greater than 2/10  Baseline: na Goal status: IN PROGRESS  3.  Patient to be able to sleep through the night  Baseline: na Goal status: MET on 09/30/2022  4.  Patient to begin and maintain a consistent exercise regimen Baseline: na Goal status: IN PROGRESS  5.  Cervical ROM to be [facility] Baseline: na Goal status: MET on 10/07/2022  6.  UE and postural strength to be 4+/5 Baseline: na Goal status: IN PROGRESS   PLAN: PT FREQUENCY: 1x/week  PT DURATION: 8 weeks  PLANNED INTERVENTIONS: Therapeutic exercises, Therapeutic activity, Neuromuscular re-education, Balance training, Patient/Family education, Self Care, Joint mobilization, Aquatic Therapy, Dry Needling, Electrical stimulation, Spinal mobilization, Cryotherapy, Moist heat, scar mobilization, Taping, Traction, Ultrasound, Ionotophoresis 14m/ml Dexamethasone, Manual therapy, and Re-evaluation  PLAN FOR NEXT SESSION: Manual/Dry needling as indicated, flexibility, progress postural strength, ultrasound to cervical region if indicated.   SJuel Burrow PT 12/09/22 12:31 PM   [facility] [address] [place] [address]Phone # [phone] [phone]

## 2022-12-20 DIAGNOSIS — R5383 Other fatigue: Secondary | ICD-10-CM | POA: Diagnosis not present

## 2022-12-20 DIAGNOSIS — J069 Acute upper respiratory infection, unspecified: Secondary | ICD-10-CM | POA: Diagnosis not present

## 2022-12-20 DIAGNOSIS — R509 Fever, unspecified: Secondary | ICD-10-CM | POA: Diagnosis not present

## 2022-12-20 DIAGNOSIS — R051 Acute cough: Secondary | ICD-10-CM | POA: Diagnosis not present

## 2022-12-20 DIAGNOSIS — Z03818 Encounter for observation for suspected exposure to other biological agents ruled out: Secondary | ICD-10-CM | POA: Diagnosis not present

## 2022-12-23 ENCOUNTER — Encounter: Payer: Medicare Other | Admitting: Rehabilitative and Restorative Service Providers"

## 2022-12-24 DIAGNOSIS — I776 Arteritis, unspecified: Secondary | ICD-10-CM | POA: Diagnosis not present

## 2022-12-24 DIAGNOSIS — Z1211 Encounter for screening for malignant neoplasm of colon: Secondary | ICD-10-CM | POA: Diagnosis not present

## 2022-12-24 DIAGNOSIS — M79671 Pain in right foot: Secondary | ICD-10-CM | POA: Diagnosis not present

## 2022-12-24 DIAGNOSIS — R931 Abnormal findings on diagnostic imaging of heart and coronary circulation: Secondary | ICD-10-CM | POA: Diagnosis not present

## 2022-12-24 DIAGNOSIS — R051 Acute cough: Secondary | ICD-10-CM | POA: Diagnosis not present

## 2022-12-26 ENCOUNTER — Ambulatory Visit: Payer: Medicare Other

## 2022-12-26 DIAGNOSIS — M6281 Muscle weakness (generalized): Secondary | ICD-10-CM

## 2022-12-26 DIAGNOSIS — M542 Cervicalgia: Secondary | ICD-10-CM | POA: Diagnosis not present

## 2022-12-26 DIAGNOSIS — R252 Cramp and spasm: Secondary | ICD-10-CM | POA: Diagnosis not present

## 2022-12-26 DIAGNOSIS — R293 Abnormal posture: Secondary | ICD-10-CM | POA: Diagnosis not present

## 2022-12-26 NOTE — Therapy (Signed)
OUTPATIENT PHYSICAL THERAPY TREATMENT NOTE    Patient Name: Charlene Hunt MRN: [id number] DOB:Jun 09, 1951, 72 y.o., female Today's Date: 12/26/2022      PT End of Session - 12/26/22 1150     Visit Number 24    Date for PT Re-Evaluation 01/24/23    Authorization Type [employer]    Progress Note Due on Visit 6    PT Start Time [id number]    PT Stop Time 1225    PT Time Calculation (min) 40 min    Activity Tolerance Patient tolerated treatment well    Behavior During Therapy WFL for tasks assessed/performed              Past Medical History:  Diagnosis Date   Abnormal finding on thyroid function test    Acid reflux    ADD (attention deficit disorder)    Allergies    Anxiety    Asymptomatic menopausal state    Current every day vaping    Depression    Diverticulosis    Elevated coronary artery calcium score    Endometrial disorder    Erythema nodosum    Family history of breast cancer    Flat feet    GERD (gastroesophageal reflux disease)    H/O menorrhagia    Herpesviral infection of urogenital system    Hypothyroidism    Insomnia    Memory impairment    Migraine    OSA (obstructive sleep apnea)    Osteopenia    Plantar fasciitis    Sleep disorder    Vitamin D deficiency    Past Surgical History:  Procedure Laterality Date   C SECTIONS     CATARACTS     DILATION AND CURETTAGE OF UTERUS     ENDOMETRIAL ABLATION     NASAL SEPTOPLASTY FOR DEVIATED SEPTUM     TONSILLECTOMY     TORN RETINA     [place]     Patient Active Problem List   Diagnosis Date Noted   Cervicalgia 08/13/2022    PCP: Glenis Smoker, MD  REFERRING PROVIDER: Inez Catalina, MD   REFERRING DIAG: M54.2 (ICD-10-CM) - Cervicalgia   THERAPY DIAG:  Cervicalgia  Cramp and spasm  Muscle weakness (generalized)  Rationale for Evaluation and Treatment Rehabilitation  ONSET DATE: 08/08/2022   SUBJECTIVE:                                                                                                                                                                                                          SUBJECTIVE STATEMENT:  Pt reports she had minimal to no pain during her trip.  She actually fell flat on her back on ice on the first day in [place]. She was sure she had injured herself but actually felt no pain and had no pain throughout her whole trip.    PERTINENT HISTORY:  Sleep apnea, insomnia, excessive stress  PAIN:  Are you having pain? Yes: NPRS scale: 5/10 Pain location: left side of neck and upper trap Pain description: aching Aggravating factors: nothing in particular Relieving factors: Tylenol, heat  PRECAUTIONS: None  WEIGHT BEARING RESTRICTIONS No  FALLS:  Has patient fallen in last 6 months? Yes. Number of falls was carrying her grandson up some steps and he was writhing around.   OCCUPATION: retired Agricultural engineer  PLOF: Independent, Independent with basic ADLs, Independent with household mobility without device, Independent with community mobility without device, Independent with homemaking with ambulation, Independent with gait, and Independent with transfers  PATIENT GOALS   Less pain  OBJECTIVE:   DIAGNOSTIC FINDINGS:  na  PATIENT SURVEYS:  Eval:  FOTO 56 (goal is 60) 09/30/2022:  FOTO 61%   COGNITION: Overall cognitive status: Within functional limits for tasks assessed   SENSATION: WFL Patient reports history of some symptoms associated with carpal tunnel bilaterally and left thumb trigger finger,  also hx of mild left shoulder injury in yoga.   POSTURE: rounded shoulders and forward head slight kyphosis  PALPATION: Tender, tight bands, trigger points bilateral upper traps, parascapular areas Left > right    CERVICAL ROM:   Active ROM A/PROM (deg) eval A/ROM (Deg) 09/09/22 A/ROM (Deg) 10/07/22 A/ROM (deg) 11/25/22  Flexion WNL 65 65 70  Extension 50 50 55 60  Right lateral  flexion 32 35 40 40  Left lateral flexion 30 35 38 40  Right rotation 42 55 60 82  Left rotation 45 55 60 60   (Blank rows = not tested)  UPPER EXTREMITY ROM:  WNL  UPPER EXTREMITY MMT:  Eval:  Left shoulder ER and scaption with IR both 3+/5,  all others generally 4+/5 11/25/2022:  Left shoulder ER and scaption with IR both 4-/5,  all others generally 4+/5  CERVICAL SPECIAL TESTS:  Eval:  Spurling's test: Negative   FUNCTIONAL TESTS:  Eval:  5 times sit to stand : 14.06 sec  10/07/2022: 5 times sit to stand:  8.24 sec  TODAY'S TREATMENT:  12/26/2022: UBE level 1.2 x 2.5 min each fwd and back with PT present to discuss status UE band walks on back wall: 2 laps each direction from finger ladder to steps with green loop Lat pull downs in standing 2 x 10 with 25# Standing shoulder flexion and scaption with 2# 2 x 10 Standing facing wall, lower trap lift offs 2 x 10 TYI's over red physio ball, feet on floor, ball on mat table with 1# dumbbells.  x10 Quadruped thoracic rotation "thread the needle"  x 5 each side holding 5 sec each  Open book stretch x 10 each side Seated rows with green band  2x10 Ultrasound in prone to cervical and upper trap region:  3.3 MHz, 50% duty cycle, 1.3 W/cm2 for 5 min  12/09/2022: UBE level 1.2 x3 min each direction with PT present to discuss status TYI's over red physio ball, feet on floor, ball on mat table with 1# dumbbells.  2x10 Seated rows 25# 2x10 Standing lat pulldown 30# 2x10 UE band walks on back wall: 2 laps each direction from finger ladder to steps  with green loop Standing facing wall, lower trap lift offs 2 x 10 Wall pushups 2x10 Ultrasound in prone to cervical and upper trap region:  3.3 MHz, 50% duty cycle, 1.3 W/cm2 for 8 min   12/02/2022: UBE level 1.2 x3 min each direction with PT present to discuss status TYI's over red physio ball, feet on floor, ball on mat table with 1# dumbbells.  2x10 Seated rows 25# 2x10 Standing lat  pulldown 30# 2x10 UE band walks on back wall: 2 laps each direction from finger ladder to steps with green loop Standing facing wall, lower trap lift offs 2 x 10 Wall pushups 2x10 Ultrasound in prone to cervical and upper trap region:  3.3 MHz, 50% duty cycle, 1.3 W/cm2 for 8 min Seated upper trap and levator stretch 2x20 sec bilat each    PATIENT EDUCATION:  Education details: Initiated HEP, educated on dry needling and provided handout Person educated: Patient Education method: Consulting civil engineer, Media planner, Verbal cues, and Handouts Education comprehension: verbalized understanding, returned demonstration, and verbal cues required  HOME EXERCISE PROGRAM: Access Code: [email or web address] URL: [email or web address] Date: 11/25/2022 Prepared by: Shelby Dubin Menke  Exercises - Seated Upper Trapezius Stretch  - 1 x daily - 7 x weekly - 1 sets - 2 reps - 20 sec hold - Seated Levator Scapulae Stretch  - 1 x daily - 7 x weekly - 1 sets - 2 reps - 20 sec hold - Seated Scapular Retraction  - 1 x daily - 7 x weekly - 3 sets - 10 reps - Shoulder Rolls in Sitting  - 1 x daily - 7 x weekly - 3 sets - 10 reps - Seated Cervical Retraction  - 1 x daily - 7 x weekly - 3 sets - 10 reps - Seated Assisted Cervical Rotation with Towel  - 1 x daily - 7 x weekly - 2 sets - 10 reps - Shoulder extension with resistance - Neutral  - 1 x daily - 7 x weekly - 3 sets - 10 reps - Standing Shoulder Row with Anchored Resistance  - 1 x daily - 7 x weekly - 3 sets - 10 reps - Seated Shoulder Horizontal Abduction with Resistance  - 1 x daily - 7 x weekly - 3 sets - 10 reps - Shoulder External Rotation and Scapular Retraction with Resistance  - 1 x daily - 7 x weekly - 2 sets - 10 reps - Sidelying Shoulder External Rotation  - 2 x daily - 7 x weekly - 2 sets - 10 reps - Single Arm Serratus Punches in Supine with Dumbbell  - 2 x daily - 7 x weekly - 2 sets - 10 reps - Bent Over Single Arm Shoulder Row with Dumbbell  - 1  x daily - 7 x weekly - 2 sets - 10 reps - Single Arm Bent Over Shoulder Extension with Dumbbell  - 1 x daily - 7 x weekly - 2 sets - 10 reps - Single Arm Bent Over Shoulder Horizontal Abduction with Dumbbell - Palm Down  - 1 x daily - 7 x weekly - 2 sets - 10 reps  ASSESSMENT:  CLINICAL IMPRESSION: Ms Rozario did very well on her trip and reported no pain despite a fall.  She was able to complete all exercises today with ease.  She should continue to do well.  We will see her for 1-2 more visits to insure she does not have an exacerbation of symptoms.     OBJECTIVE IMPAIRMENTS decreased cognition, decreased  ROM, decreased strength, increased fascial restrictions, increased muscle spasms, impaired flexibility, postural dysfunction, and pain.   ACTIVITY LIMITATIONS carrying, lifting, sleeping, transfers, bed mobility, dressing, reach over head, and hygiene/grooming  PARTICIPATION LIMITATIONS: meal prep, cleaning, laundry, driving, shopping, community activity, occupation, and yard work  PERSONAL FACTORS Behavior pattern, Fitness, Past/current experiences, Time since onset of injury/illness/exacerbation, and 1-2 comorbidities: insomnia,  are also affecting patient's functional outcome.   REHAB POTENTIAL: Good  CLINICAL DECISION MAKING: Stable/uncomplicated  EVALUATION COMPLEXITY: Low   GOALS: Goals reviewed with patient? Yes  SHORT TERM GOALS: Target date: 09/10/2022   Patient will be independent with initial HEP  Baseline: na Goal status: MET  2.  Pain report to be no greater than 4/10  Baseline: 6/10  Goal status: MET on 11/25/2022  3.  Patient to be able to fall asleep no later than 10pm and sleep consecutive 4 hours Baseline: na Goal status: MET on 09/12/2022 with pt reporting that she can sleep for 5 hours, pain no longer waking her up at night   LONG TERM GOALS: Target date: 01/24/2023  Patient to be independent with advanced HEP  Baseline: na Goal status: IN  PROGRESS  2.  Patient to report pain no greater than 2/10  Baseline: na Goal status: IN PROGRESS  3.  Patient to be able to sleep through the night  Baseline: na Goal status: MET on 09/30/2022  4.  Patient to begin and maintain a consistent exercise regimen Baseline: na Goal status: IN PROGRESS  5.  Cervical ROM to be [facility] Baseline: na Goal status: MET on 10/07/2022  6.  UE and postural strength to be 4+/5 Baseline: na Goal status: IN PROGRESS   PLAN: PT FREQUENCY: 1x/week  PT DURATION: 8 weeks  PLANNED INTERVENTIONS: Therapeutic exercises, Therapeutic activity, Neuromuscular re-education, Balance training, Patient/Family education, Self Care, Joint mobilization, Aquatic Therapy, Dry Needling, Electrical stimulation, Spinal mobilization, Cryotherapy, Moist heat, scar mobilization, Taping, Traction, Ultrasound, Ionotophoresis '4mg'$ /ml Dexamethasone, Manual therapy, and Re-evaluation  PLAN FOR NEXT SESSION: Manual/Dry needling as indicated, flexibility, progress postural strength, ultrasound to cervical region if indicated.   Anderson Malta B. Genesis Paget, PT 12/26/22 1:34 PM  [facility] [address], [address] [place], [place] [address] Phone # [phone] Fax [phone]

## 2022-12-31 ENCOUNTER — Ambulatory Visit: Payer: Medicare Other | Admitting: Podiatry

## 2022-12-31 ENCOUNTER — Other Ambulatory Visit: Payer: Self-pay | Admitting: Podiatry

## 2022-12-31 ENCOUNTER — Ambulatory Visit (INDEPENDENT_AMBULATORY_CARE_PROVIDER_SITE_OTHER): Payer: Medicare Other

## 2022-12-31 ENCOUNTER — Encounter: Payer: Self-pay | Admitting: Podiatry

## 2022-12-31 DIAGNOSIS — M722 Plantar fascial fibromatosis: Secondary | ICD-10-CM

## 2022-12-31 DIAGNOSIS — M778 Other enthesopathies, not elsewhere classified: Secondary | ICD-10-CM | POA: Diagnosis not present

## 2022-12-31 MED ORDER — METHYLPREDNISOLONE 4 MG PO TBPK: ORAL_TABLET | ORAL | 0 refills | Status: DC

## 2022-12-31 MED ORDER — MELOXICAM 15 MG PO TABS: 15.0000 mg | ORAL_TABLET | Freq: Every day | ORAL | 3 refills | Status: DC

## 2022-12-31 MED ORDER — TRIAMCINOLONE ACETONIDE 40 MG/ML IJ SUSP
40.0000 mg | Freq: Once | INTRAMUSCULAR | Status: AC
  Administered 2022-12-31: 40 mg

## 2023-01-01 NOTE — Progress Notes (Signed)
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 female presents with the above complaint.   ROS: Denies fever chills nausea vomit muscle aches pains calf pain back pain chest pain shortness of breath.  Past Medical History:  Diagnosis Date   Abnormal finding on thyroid function test    Acid reflux    ADD (attention deficit disorder)    Allergies    Anxiety    Asymptomatic menopausal state    Current every day vaping    Depression    Diverticulosis    Elevated coronary artery calcium score    Endometrial disorder    Erythema nodosum    Family history of breast cancer    Flat feet    GERD (gastroesophageal reflux disease)    H/O menorrhagia    Herpesviral infection of urogenital system    Hypothyroidism    Insomnia    Memory impairment    Migraine    OSA (obstructive sleep apnea)    Osteopenia    Plantar fasciitis    Sleep disorder    Vitamin D deficiency    Past Surgical History:  Procedure Laterality Date   C SECTIONS     CATARACTS     DILATION AND CURETTAGE OF UTERUS     ENDOMETRIAL ABLATION     NASAL SEPTOPLASTY FOR DEVIATED SEPTUM     TONSILLECTOMY     TORN RETINA     WISDOMTEETH      Current Outpatient Medications:    DULoxetine (CYMBALTA) 30 MG capsule, Take by mouth., Disp: , Rfl:    meloxicam (MOBIC) 15 MG tablet, Take 1 tablet (15 mg total) by mouth daily., Disp: 30 tablet, Rfl: 3   methylPREDNISolone (MEDROL DOSEPAK) 4 MG TBPK tablet, 6 day dose pack - take as  directed, Disp: 21 tablet, Rfl: 0   Vitamin D, Ergocalciferol, (DRISDOL) 1.25 MG (50000 UNIT) CAPS capsule, Take by mouth., Disp: , Rfl:    albuterol (VENTOLIN HFA) 108 (90 Base) MCG/ACT inhaler, Inhale into the lungs every 6 (six) hours as needed for wheezing or shortness of breath., Disp: , Rfl:    fluticasone (FLONASE) 50 MCG/ACT nasal spray, Place 1 spray into both nostrils at bedtime., Disp: , Rfl:    loratadine (CLARITIN) 10 MG tablet, Take 10 mg by mouth daily as needed for allergies., Disp: , Rfl:    omeprazole (PRILOSEC) 10 MG capsule, Take 10 mg by mouth at bedtime. Sinus issues/heart burn, Disp: , Rfl:    rosuvastatin (CRESTOR) 10 MG tablet, Take 10 mg by mouth daily., Disp: , Rfl:   Allergies  Allergen Reactions   Ciprofloxacin    Fish Allergy    Penicillins Hives   Review of Systems Objective:  There were no vitals filed for this visit.  General: Well developed, nourished, in no acute distress, alert and oriented x3   Dermatological: Skin is warm, dry and supple bilateral. Nails x 10 are well  maintained; remaining integument appears unremarkable at this time. There are no open sores, no preulcerative lesions, no rash or signs of infection present.  Vascular: Dorsalis Pedis artery and Posterior Tibial artery pedal pulses are 2/4 bilateral with immedate capillary fill time. Pedal hair growth present. No varicosities and no lower extremity edema present bilateral.   Neruologic: Grossly intact via light touch bilateral. Vibratory intact via tuning fork bilateral. Protective threshold with Semmes Wienstein monofilament intact to all pedal sites bilateral. Patellar and Achilles deep tendon reflexes 2+ bilateral. No Babinski or clonus noted bilateral.   Musculoskeletal: No gross boney pedal deformities bilateral. No pain, crepitus, or limitation noted with foot and ankle range of motion bilateral. Muscular strength 5/5 in all groups tested bilateral.  She has pain on end range of  motion of the second and third metatarsophalangeal joints of the right foot.  Left foot demonstrates tenderness in the heel on palpation of the plantar fascia calcaneal insertion site.  No pain on medial lateral compression of the Calcaneus.  Gait: Unassisted, Nonantalgic.    Radiographs:  Radiographs taken today bilateral foot demonstrate generalized good mineralization.  Normal rectus architecture.  She does have some tufted soft tissue swelling at the plantar fascia calcaneal insertion site of her left heel.  This is primary finding otherwise no other significant osseous abnormalities.  Assessment & Plan:   Assessment: Capsulitis second and third metatarsophalangeal joints right foot.  Left foot demonstrates Planter fasciitis.  Plan: Injected Kenalog around the second metatarsal phalangeal joint of the right foot a total of 10 mg was injected with local anesthetic.  Plantar fascia was injected today with 20 mg Kenalog 5 mg Marcaine point of maximal tenderness.  Started her on methylprednisolone to be followed by meloxicam.  We discussed appropriate shoe gear stretching exercise ice therapy and shoe gear modifications.  I would like to follow-up with her in about 1 month     Barry Culverhouse T. [place], [place]

## 2023-01-02 ENCOUNTER — Encounter: Payer: Self-pay | Admitting: Rehabilitative and Restorative Service Providers"

## 2023-01-02 ENCOUNTER — Ambulatory Visit: Payer: Medicare Other | Attending: Sports Medicine | Admitting: Rehabilitative and Restorative Service Providers"

## 2023-01-02 DIAGNOSIS — R293 Abnormal posture: Secondary | ICD-10-CM

## 2023-01-02 DIAGNOSIS — M542 Cervicalgia: Secondary | ICD-10-CM

## 2023-01-02 DIAGNOSIS — M6281 Muscle weakness (generalized): Secondary | ICD-10-CM

## 2023-01-02 DIAGNOSIS — R252 Cramp and spasm: Secondary | ICD-10-CM

## 2023-01-02 NOTE — Therapy (Signed)
OUTPATIENT PHYSICAL THERAPY TREATMENT NOTE    Patient Name: Charlene Hunt MRN: [id number] DOB:1951/01/02, 72 y.o., female Today's Date: 01/02/2023      PT End of Session - 01/02/23 0933     Visit Number 25    Date for PT Re-Evaluation 01/24/23    Authorization Type [employer]    Progress Note Due on Visit 9    PT Start Time 0930    PT Stop Time 1010    PT Time Calculation (min) 40 min    Activity Tolerance Patient tolerated treatment well    Behavior During Therapy WFL for tasks assessed/performed              Past Medical History:  Diagnosis Date   Abnormal finding on thyroid function test    Acid reflux    ADD (attention deficit disorder)    Allergies    Anxiety    Asymptomatic menopausal state    Current every day vaping    Depression    Diverticulosis    Elevated coronary artery calcium score    Endometrial disorder    Erythema nodosum    Family history of breast cancer    Flat feet    GERD (gastroesophageal reflux disease)    H/O menorrhagia    Herpesviral infection of urogenital system    Hypothyroidism    Insomnia    Memory impairment    Migraine    OSA (obstructive sleep apnea)    Osteopenia    Plantar fasciitis    Sleep disorder    Vitamin D deficiency    Past Surgical History:  Procedure Laterality Date   C SECTIONS     CATARACTS     DILATION AND CURETTAGE OF UTERUS     ENDOMETRIAL ABLATION     NASAL SEPTOPLASTY FOR DEVIATED SEPTUM     TONSILLECTOMY     TORN RETINA     [place]     Patient Active Problem List   Diagnosis Date Noted   Cervicalgia 08/13/2022    PCP: Glenis Smoker, MD  REFERRING PROVIDER: Inez Catalina, MD   REFERRING DIAG: M54.2 (ICD-10-CM) - Cervicalgia   THERAPY DIAG:  Cervicalgia  Cramp and spasm  Muscle weakness (generalized)  Abnormal posture  Rationale for Evaluation and Treatment Rehabilitation  ONSET DATE: 08/08/2022   SUBJECTIVE:  SUBJECTIVE STATEMENT:  Pt reports she feels better today.  Just states a little soreness.  States that she went to the podiatrist earlier this week and had some injections, so her feet are a little sore.  PERTINENT HISTORY:  Sleep apnea, insomnia, excessive stress  PAIN:  Are you having pain? Yes: NPRS scale: 2/10 Pain location: left side of neck and upper trap Pain description: aching Aggravating factors: nothing in particular Relieving factors: Tylenol, heat  PRECAUTIONS: None  WEIGHT BEARING RESTRICTIONS No  FALLS:  Has patient fallen in last 6 months? Yes. Number of falls was carrying her grandson up some steps and he was writhing around.   OCCUPATION: retired Agricultural engineer  PLOF: Independent, Independent with basic ADLs, Independent with household mobility without device, Independent with community mobility without device, Independent with homemaking with ambulation, Independent with gait, and Independent with transfers  PATIENT GOALS   Less pain  OBJECTIVE:   DIAGNOSTIC FINDINGS:  na  PATIENT SURVEYS:  Eval:  FOTO 56 (goal is 60) 09/30/2022:  FOTO 61%   COGNITION: Overall cognitive status: Within functional limits for tasks assessed   SENSATION: WFL Patient reports history of some symptoms associated with carpal tunnel bilaterally and left thumb trigger finger,  also hx of mild left shoulder injury in yoga.   POSTURE: rounded shoulders and forward head slight kyphosis  PALPATION: Tender, tight bands, trigger points bilateral upper traps, parascapular areas Left > right    CERVICAL ROM:   Active ROM A/PROM (deg) eval A/ROM (Deg) 09/09/22 A/ROM (Deg) 10/07/22 A/ROM (deg) 11/25/22  Flexion WNL 65 65 70  Extension 50 50 55 60  Right lateral flexion 32 35 40 40  Left lateral  flexion 30 35 38 40  Right rotation 42 55 60 82  Left rotation 45 55 60 60   (Blank rows = not tested)  UPPER EXTREMITY ROM:  WNL  UPPER EXTREMITY MMT:  Eval:  Left shoulder ER and scaption with IR both 3+/5,  all others generally 4+/5 11/25/2022:  Left shoulder ER and scaption with IR both 4-/5,  all others generally 4+/5  CERVICAL SPECIAL TESTS:  Eval:  Spurling's test: Negative   FUNCTIONAL TESTS:  Eval:  5 times sit to stand : 14.06 sec  10/07/2022: 5 times sit to stand:  8.24 sec  TODAY'S TREATMENT:   01/02/2023: UBE level 1.2 x3 min each direction with PT present to discuss status UE band walks on back wall: 2 laps each direction from finger ladder to steps with green loop Standing lat pulldown 25# 2x10 Standing shoulder flexion and scaption with 2# 2 x 10 Standing facing wall, lower trap lift offs 2 x 10 Quadruped thoracic rotation "thread the needle"  x 5 each side holding 5 sec each Open book stretch  2x10 each side TYI's over red physio ball, feet on floor, ball on mat table with 2# dumbbells.  2x10 Ultrasound in prone to cervical and upper trap region:  3.3 MHz, 50% duty cycle, 1.3 W/cm2 for 8 min   12/26/2022: UBE level 1.2 x 2.5 min each fwd and back with PT present to discuss status UE band walks on back wall: 2 laps each direction from finger ladder to steps with green loop Lat pull downs in standing 2 x 10 with 25# Standing shoulder flexion and scaption with 2# 2 x 10 Standing facing wall, lower trap lift offs 2 x 10 TYI's over red physio ball, feet on floor, ball on mat table with 1# dumbbells.  x10 Quadruped thoracic rotation "thread the needle"  x 5 each side holding 5 sec each  Open book stretch x 10 each side Seated rows with green band  2x10 Ultrasound in prone to cervical and upper trap region:  3.3 MHz, 50% duty cycle, 1.3 W/cm2 for 5 min  12/09/2022: UBE level 1.2 x3 min each direction with PT present to discuss status TYI's over red physio  ball, feet on floor, ball on mat table with 1# dumbbells.  2x10 Seated rows 25# 2x10 Standing lat pulldown 30# 2x10 UE band walks on back wall: 2 laps each direction from finger ladder to steps with green loop Standing facing wall, lower trap lift offs 2 x 10 Wall pushups 2x10 Ultrasound in prone to cervical and upper trap region:  3.3 MHz, 50% duty cycle, 1.3 W/cm2 for 8 min     PATIENT EDUCATION:  Education details: Initiated HEP, educated on dry needling and provided handout Person educated: Patient Education method: Consulting civil engineer, Media planner, Verbal cues, and Handouts Education comprehension: verbalized understanding, returned demonstration, and verbal cues required  HOME EXERCISE PROGRAM: Access Code: [email or web address] URL: [email or web address] Date: 11/25/2022 Prepared by: Shelby Dubin Asser Lucena  Exercises - Seated Upper Trapezius Stretch  - 1 x daily - 7 x weekly - 1 sets - 2 reps - 20 sec hold - Seated Levator Scapulae Stretch  - 1 x daily - 7 x weekly - 1 sets - 2 reps - 20 sec hold - Seated Scapular Retraction  - 1 x daily - 7 x weekly - 3 sets - 10 reps - Shoulder Rolls in Sitting  - 1 x daily - 7 x weekly - 3 sets - 10 reps - Seated Cervical Retraction  - 1 x daily - 7 x weekly - 3 sets - 10 reps - Seated Assisted Cervical Rotation with Towel  - 1 x daily - 7 x weekly - 2 sets - 10 reps - Shoulder extension with resistance - Neutral  - 1 x daily - 7 x weekly - 3 sets - 10 reps - Standing Shoulder Row with Anchored Resistance  - 1 x daily - 7 x weekly - 3 sets - 10 reps - Seated Shoulder Horizontal Abduction with Resistance  - 1 x daily - 7 x weekly - 3 sets - 10 reps - Shoulder External Rotation and Scapular Retraction with Resistance  - 1 x daily - 7 x weekly - 2 sets - 10 reps - Sidelying Shoulder External Rotation  - 2 x daily - 7 x weekly - 2 sets - 10 reps - Single Arm Serratus Punches in Supine with Dumbbell  - 2 x daily - 7 x weekly - 2 sets - 10 reps - Bent Over  Single Arm Shoulder Row with Dumbbell  - 1 x daily - 7 x weekly - 2 sets - 10 reps - Single Arm Bent Over Shoulder Extension with Dumbbell  - 1 x daily - 7 x weekly - 2 sets - 10 reps - Single Arm Bent Over Shoulder Horizontal Abduction with Dumbbell - Palm Down  - 1 x daily - 7 x weekly - 2 sets - 10 reps  ASSESSMENT:  CLINICAL IMPRESSION: Ms Hedin presents to skilled PT reporting that she is feeling better overall today, but still having some soreness.  Patient continues to be able to progress with strengthening exercises.  Patient continues to report relief of pain with ultrasound.  Pt is demonstrating improved posture throughout session.   OBJECTIVE IMPAIRMENTS decreased cognition, decreased ROM,  decreased strength, increased fascial restrictions, increased muscle spasms, impaired flexibility, postural dysfunction, and pain.   ACTIVITY LIMITATIONS carrying, lifting, sleeping, transfers, bed mobility, dressing, reach over head, and hygiene/grooming  PARTICIPATION LIMITATIONS: meal prep, cleaning, laundry, driving, shopping, community activity, occupation, and yard work  PERSONAL FACTORS Behavior pattern, Fitness, Past/current experiences, Time since onset of injury/illness/exacerbation, and 1-2 comorbidities: insomnia,  are also affecting patient's functional outcome.   REHAB POTENTIAL: Good  CLINICAL DECISION MAKING: Stable/uncomplicated  EVALUATION COMPLEXITY: Low   GOALS: Goals reviewed with patient? Yes  SHORT TERM GOALS: Target date: 09/10/2022   Patient will be independent with initial HEP  Baseline: na Goal status: MET  2.  Pain report to be no greater than 4/10  Baseline: 6/10  Goal status: MET on 11/25/2022  3.  Patient to be able to fall asleep no later than 10pm and sleep consecutive 4 hours Baseline: na Goal status: MET on 09/12/2022 with pt reporting that she can sleep for 5 hours, pain no longer waking her up at night   LONG TERM GOALS: Target date:  01/24/2023  Patient to be independent with advanced HEP  Baseline: na Goal status: IN PROGRESS  2.  Patient to report pain no greater than 2/10  Baseline: na Goal status: IN PROGRESS  3.  Patient to be able to sleep through the night  Baseline: na Goal status: MET on 09/30/2022  4.  Patient to begin and maintain a consistent exercise regimen Baseline: na Goal status: IN PROGRESS  5.  Cervical ROM to be [facility] Baseline: na Goal status: MET on 10/07/2022  6.  UE and postural strength to be 4+/5 Baseline: na Goal status: IN PROGRESS   PLAN: PT FREQUENCY: 1x/week  PT DURATION: 8 weeks  PLANNED INTERVENTIONS: Therapeutic exercises, Therapeutic activity, Neuromuscular re-education, Balance training, Patient/Family education, Self Care, Joint mobilization, Aquatic Therapy, Dry Needling, Electrical stimulation, Spinal mobilization, Cryotherapy, Moist heat, scar mobilization, Taping, Traction, Ultrasound, Ionotophoresis '4mg'$ /ml Dexamethasone, Manual therapy, and Re-evaluation  PLAN FOR NEXT SESSION: Manual/Dry needling as indicated, flexibility, progress postural strength, ultrasound to cervical region if indicated.   Juel Burrow, PT 01/02/23 10:16 AM   [facility] [address] [place], [place] [address] Phone # [phone] Fax [phone]

## 2023-01-09 ENCOUNTER — Ambulatory Visit: Payer: Medicare Other

## 2023-01-09 DIAGNOSIS — M542 Cervicalgia: Secondary | ICD-10-CM

## 2023-01-09 DIAGNOSIS — R293 Abnormal posture: Secondary | ICD-10-CM | POA: Diagnosis not present

## 2023-01-09 DIAGNOSIS — M6281 Muscle weakness (generalized): Secondary | ICD-10-CM | POA: Diagnosis not present

## 2023-01-09 DIAGNOSIS — R252 Cramp and spasm: Secondary | ICD-10-CM

## 2023-01-09 NOTE — Therapy (Signed)
OUTPATIENT PHYSICAL THERAPY TREATMENT NOTE    Patient Name: Charlene Hunt MRN: [id number] DOB:[birth year], 72 y.o., female Today's Date: 01/09/2023      PT End of Session - 01/09/23 1015     Visit Number 26    Date for PT Re-Evaluation 01/24/23    Authorization Type [employer]IC[employer]    Progress Note Due on Visit 86    PT Start Time 1015    PT Stop Time 1100    PT Time Calculation (min) 45 min    Activity Tolerance Patient tolerated treatment well    Behavior During Therapy WFL for tasks assessed/performed              Past Medical History:  Diagnosis Date   Abnormal finding on thyroid function test    Acid reflux    ADD (attention deficit disorder)    Allergies    Anxiety    Asymptomatic menopausal state    Current every day vaping    Depression    Diverticulosis    Elevated coronary artery calcium score    Endometrial disorder    Erythema nodosum    Family history of breast cancer    Flat feet    GERD (gastroesophageal reflux disease)    H/O menorrhagia    Herpesviral infection of urogenital system    Hypothyroidism    Insomnia    Memory impairment    Migraine    OSA (obstructive sleep apnea)    Osteopenia    Plantar fasciitis    Sleep disorder    Vitamin D deficiency    Past Surgical History:  Procedure Laterality Date   C SECTIONS     CATARACTS     DILATION AND CURETTAGE OF UTERUS     ENDOMETRIAL ABLATION     NASAL SEPTOPLASTY FOR DEVIATED SEPTUM     TONSILLECTOMY     TORN RETINA     [place]     Patient Active Problem List   Diagnosis Date Noted   Cervicalgia 08/13/2022    PCP: Glenis Smoker, MD  REFERRING PROVIDER: Inez Catalina, MD   REFERRING DIAG: M54.2 (ICD-10-CM) - Cervicalgia   THERAPY DIAG:  Cervicalgia  Muscle weakness (generalized)  Cramp and spasm  Abnormal posture  Rationale for Evaluation and Treatment Rehabilitation  ONSET DATE: 08/08/2022   SUBJECTIVE:  SUBJECTIVE STATEMENT:  Pt reports neck continues to feel good.  My feet are still a little tender but I am on my second dose pack.    PERTINENT HISTORY:  Sleep apnea, insomnia, excessive stress  PAIN:  Are you having pain? Yes: NPRS scale: 2/10 Pain location: left side of neck and upper trap Pain description: aching Aggravating factors: nothing in particular Relieving factors: Tylenol, heat  PRECAUTIONS: None  WEIGHT BEARING RESTRICTIONS No  FALLS:  Has patient fallen in last 6 months? Yes. Number of falls was carrying her grandson up some steps and he was writhing around.   OCCUPATION: retired Agricultural engineer  PLOF: Independent, Independent with basic ADLs, Independent with household mobility without device, Independent with community mobility without device, Independent with homemaking with ambulation, Independent with gait, and Independent with transfers  PATIENT GOALS   Less pain  OBJECTIVE:   DIAGNOSTIC FINDINGS:  na  PATIENT SURVEYS:  Eval:  FOTO 56 (goal is 60) 09/30/2022:  FOTO 61%   COGNITION: Overall cognitive status: Within functional limits for tasks assessed   SENSATION: WFL Patient reports history of some symptoms associated with carpal tunnel bilaterally and left thumb trigger finger,  also hx of mild left shoulder injury in yoga.   POSTURE: rounded shoulders and forward head slight kyphosis  PALPATION: Tender, tight bands, trigger points bilateral upper traps, parascapular areas Left > right    CERVICAL ROM:   Active ROM A/PROM (deg) eval A/ROM (Deg) 09/09/22 A/ROM (Deg) 10/07/22 A/ROM (deg) 11/25/22  Flexion WNL 65 65 70  Extension 50 50 55 60  Right lateral flexion 32 35 40 40  Left lateral flexion 30 35 38 40  Right rotation 42 55 60 82  Left rotation 45 55 60  60   (Blank rows = not tested)  UPPER EXTREMITY ROM:  WNL  UPPER EXTREMITY MMT:  Eval:  Left shoulder ER and scaption with IR both 3+/5,  all others generally 4+/5 11/25/2022:  Left shoulder ER and scaption with IR both 4-/5,  all others generally 4+/5  CERVICAL SPECIAL TESTS:  Eval:  Spurling's test: Negative   FUNCTIONAL TESTS:  Eval:  5 times sit to stand : 14.06 sec  10/07/2022: 5 times sit to stand:  8.24 sec  TODAY'S TREATMENT:   01/09/2023: NuStep x 5 min (mostly arms) level [employer] shoulder machine in cancer rehab gym: bilateral shoulder extension with eccentric control 7 lbs both [employer]otion shoulder row with 7 lbs 2 x 10 4D UE ranger x 20 each direction (both) UE band walks on back wall: 1 lap along long wall in cancer rehab gym  Supine PROM to cervical spine with upper trap and levator stretch along with sub-occipital release  01/02/2023: UBE level 1.2 x3 min each direction with PT present to discuss status UE band walks on back wall: 2 laps each direction from finger ladder to steps with green loop Standing lat pulldown 25# 2x10 Standing shoulder flexion and scaption with 2# 2 x 10 Standing facing wall, lower trap lift offs 2 x 10 Quadruped thoracic rotation "thread the needle"  x 5 each side holding 5 sec each Open book stretch  2x10 each side TYI's over red physio ball, feet on floor, ball on mat table with 2# dumbbells.  2x10 Ultrasound in prone to cervical and upper trap region:  3.3 MHz, 50% duty cycle, 1.3 W/cm2 for 8 min   12/26/2022: UBE level 1.2 x 2.5 min each fwd and back with PT present to discuss status  UE band walks on back wall: 2 laps each direction from finger ladder to steps with green loop Lat pull downs in standing 2 x 10 with 25# Standing shoulder flexion and scaption with 2# 2 x 10 Standing facing wall, lower trap lift offs 2 x 10 TYI's over red physio ball, feet on floor, ball on mat table with 1# dumbbells.  x10 Quadruped thoracic  rotation "thread the needle"  x 5 each side holding 5 sec each  Open book stretch x 10 each side Seated rows with green band  2x10 Ultrasound in prone to cervical and upper trap region:  3.3 MHz, 50% duty cycle, 1.3 W/cm2 for 5 min    PATIENT EDUCATION:  Education details: Initiated HEP, educated on dry needling and provided handout Person educated: Patient Education method: Consulting civil engineer, Media planner, Verbal cues, and Handouts Education comprehension: verbalized understanding, returned demonstration, and verbal cues required  HOME EXERCISE PROGRAM: Access Code: [email or web address] URL: [email or web address] Date: 11/25/2022 Prepared by: Shelby Dubin Menke  Exercises - Seated Upper Trapezius Stretch  - 1 x daily - 7 x weekly - 1 sets - 2 reps - 20 sec hold - Seated Levator Scapulae Stretch  - 1 x daily - 7 x weekly - 1 sets - 2 reps - 20 sec hold - Seated Scapular Retraction  - 1 x daily - 7 x weekly - 3 sets - 10 reps - Shoulder Rolls in Sitting  - 1 x daily - 7 x weekly - 3 sets - 10 reps - Seated Cervical Retraction  - 1 x daily - 7 x weekly - 3 sets - 10 reps - Seated Assisted Cervical Rotation with Towel  - 1 x daily - 7 x weekly - 2 sets - 10 reps - Shoulder extension with resistance - Neutral  - 1 x daily - 7 x weekly - 3 sets - 10 reps - Standing Shoulder Row with Anchored Resistance  - 1 x daily - 7 x weekly - 3 sets - 10 reps - Seated Shoulder Horizontal Abduction with Resistance  - 1 x daily - 7 x weekly - 3 sets - 10 reps - Shoulder External Rotation and Scapular Retraction with Resistance  - 1 x daily - 7 x weekly - 2 sets - 10 reps - Sidelying Shoulder External Rotation  - 2 x daily - 7 x weekly - 2 sets - 10 reps - Single Arm Serratus Punches in Supine with Dumbbell  - 2 x daily - 7 x weekly - 2 sets - 10 reps - Bent Over Single Arm Shoulder Row with Dumbbell  - 1 x daily - 7 x weekly - 2 sets - 10 reps - Single Arm Bent Over Shoulder Extension with Dumbbell  - 1 x daily  - 7 x weekly - 2 sets - 10 reps - Single Arm Bent Over Shoulder Horizontal Abduction with Dumbbell - Palm Down  - 1 x daily - 7 x weekly - 2 sets - 10 reps  ASSESSMENT:  CLINICAL IMPRESSION: Ms Ruggles is progressing appropriately and will likely DC as expected.  She demonstrates improved posture and overall c spine ROM.     OBJECTIVE IMPAIRMENTS decreased cognition, decreased ROM, decreased strength, increased fascial restrictions, increased muscle spasms, impaired flexibility, postural dysfunction, and pain.   ACTIVITY LIMITATIONS carrying, lifting, sleeping, transfers, bed mobility, dressing, reach over head, and hygiene/grooming  PARTICIPATION LIMITATIONS: meal prep, cleaning, laundry, driving, shopping, community activity, occupation, and yard work  PERSONAL FACTORS Behavior pattern, Fitness, Past/current experiences, Time  since onset of injury/illness/exacerbation, and 1-2 comorbidities: insomnia,  are also affecting patient's functional outcome.   REHAB POTENTIAL: Good  CLINICAL DECISION MAKING: Stable/uncomplicated  EVALUATION COMPLEXITY: Low   GOALS: Goals reviewed with patient? Yes  SHORT TERM GOALS: Target date: 09/10/2022   Patient will be independent with initial HEP  Baseline: na Goal status: MET  2.  Pain report to be no greater than 4/10  Baseline: 6/10  Goal status: MET on 11/25/2022  3.  Patient to be able to fall asleep no later than 10pm and sleep consecutive 4 hours Baseline: na Goal status: MET on 09/12/2022 with pt reporting that she can sleep for 5 hours, pain no longer waking her up at night   LONG TERM GOALS: Target date: 01/24/2023  Patient to be independent with advanced HEP  Baseline: na Goal status: IN PROGRESS  2.  Patient to report pain no greater than 2/10  Baseline: na Goal status: IN PROGRESS  3.  Patient to be able to sleep through the night  Baseline: na Goal status: MET on 09/30/2022  4.  Patient to begin and maintain a  consistent exercise regimen Baseline: na Goal status: IN PROGRESS  5.  Cervical ROM to be [facility] Baseline: na Goal status: MET on 10/07/2022  6.  UE and postural strength to be 4+/5 Baseline: na Goal status: IN PROGRESS   PLAN: PT FREQUENCY: 1x/week  PT DURATION: 8 weeks  PLANNED INTERVENTIONS: Therapeutic exercises, Therapeutic activity, Neuromuscular re-education, Balance training, Patient/Family education, Self Care, Joint mobilization, Aquatic Therapy, Dry Needling, Electrical stimulation, Spinal mobilization, Cryotherapy, Moist heat, scar mobilization, Taping, Traction, Ultrasound, Ionotophoresis '4mg'$ /ml Dexamethasone, Manual therapy, and Re-evaluation  PLAN FOR NEXT SESSION: Manual/Dry needling as indicated, flexibility, progress postural strength, ultrasound to cervical region if indicated.   Anderson Malta B. Adraine Biffle, PT 01/09/23 5:23 PM   [facility] [address], [place], [place] [address] Phone # [phone] Fax [phone]

## 2023-01-16 ENCOUNTER — Encounter: Payer: Self-pay | Admitting: Rehabilitative and Restorative Service Providers"

## 2023-01-16 ENCOUNTER — Ambulatory Visit: Payer: Medicare Other | Admitting: Rehabilitative and Restorative Service Providers"

## 2023-01-16 DIAGNOSIS — M6281 Muscle weakness (generalized): Secondary | ICD-10-CM

## 2023-01-16 DIAGNOSIS — R252 Cramp and spasm: Secondary | ICD-10-CM | POA: Diagnosis not present

## 2023-01-16 DIAGNOSIS — R293 Abnormal posture: Secondary | ICD-10-CM | POA: Diagnosis not present

## 2023-01-16 DIAGNOSIS — M542 Cervicalgia: Secondary | ICD-10-CM | POA: Diagnosis not present

## 2023-01-16 NOTE — Therapy (Signed)
OUTPATIENT PHYSICAL THERAPY TREATMENT NOTE    Patient Name: Charlene Hunt MRN: [id number] DOB:02-12-1951, 72 y.o., female Today's Date: 01/16/2023      PT End of Session - 01/16/23 1017     Visit Number 47    Date for PT Re-Evaluation 01/24/23    Authorization Type [employer]ICARE    Progress Note Due on Visit 71    PT Start Time 1015    PT Stop Time 1055    PT Time Calculation (min) 40 min    Activity Tolerance Patient tolerated treatment well    Behavior During Therapy WFL for tasks assessed/performed              Past Medical History:  Diagnosis Date   Abnormal finding on thyroid function test    Acid reflux    ADD (attention deficit disorder)    Allergies    Anxiety    Asymptomatic menopausal state    Current every day vaping    Depression    Diverticulosis    Elevated coronary artery calcium score    Endometrial disorder    Erythema nodosum    Family history of breast cancer    Flat feet    GERD (gastroesophageal reflux disease)    H/O menorrhagia    Herpesviral infection of urogenital system    Hypothyroidism    Insomnia    Memory impairment    Migraine    OSA (obstructive sleep apnea)    Osteopenia    Plantar fasciitis    Sleep disorder    Vitamin D deficiency    Past Surgical History:  Procedure Laterality Date   C SECTIONS     CATARACTS     DILATION AND CURETTAGE OF UTERUS     ENDOMETRIAL ABLATION     NASAL SEPTOPLASTY FOR DEVIATED SEPTUM     TONSILLECTOMY     TORN RETINA     [place]     Patient Active Problem List   Diagnosis Date Noted   Cervicalgia 08/13/2022    PCP: Glenis Smoker, MD  REFERRING PROVIDER: Inez Catalina, MD   REFERRING DIAG: M54.2 (ICD-10-CM) - Cervicalgia   THERAPY DIAG:  Cervicalgia  Muscle weakness (generalized)  Cramp and spasm  Abnormal posture  Rationale for Evaluation and Treatment Rehabilitation  ONSET DATE: 08/08/2022   SUBJECTIVE:  SUBJECTIVE STATEMENT:  Pt reports that she is still taking pain medication for her feet, so she wonders if that is making her neck hurt less. States that she continues to have decreased pain.  PERTINENT HISTORY:  Sleep apnea, insomnia, excessive stress  PAIN:  Are you having pain? Yes: NPRS scale: 1-2/10 Pain location: left side of neck and upper trap Pain description: aching Aggravating factors: nothing in particular Relieving factors: Tylenol, heat  PRECAUTIONS: None  WEIGHT BEARING RESTRICTIONS No  FALLS:  Has patient fallen in last 6 months? Yes. Number of falls was carrying her grandson up some steps and he was writhing around.   OCCUPATION: retired Agricultural engineer  PLOF: Independent, Independent with basic ADLs, Independent with household mobility without device, Independent with community mobility without device, Independent with homemaking with ambulation, Independent with gait, and Independent with transfers  PATIENT GOALS   Less pain  OBJECTIVE:   DIAGNOSTIC FINDINGS:  na  PATIENT SURVEYS:  Eval:  FOTO 56 (goal is 60) 09/30/2022:  FOTO 61%   COGNITION: Overall cognitive status: Within functional limits for tasks assessed   SENSATION: WFL Patient reports history of some symptoms associated with carpal tunnel bilaterally and left thumb trigger finger,  also hx of mild left shoulder injury in yoga.   POSTURE: rounded shoulders and forward head slight kyphosis  PALPATION: Tender, tight bands, trigger points bilateral upper traps, parascapular areas Left > right    CERVICAL ROM:   Active ROM A/PROM (deg) eval A/ROM (Deg) 09/09/22 A/ROM (Deg) 10/07/22 A/ROM (deg) 11/25/22  Flexion WNL 65 65 70  Extension 50 50 55 60  Right lateral flexion 32 35 40 40  Left lateral flexion 30 35  38 40  Right rotation 42 55 60 82  Left rotation 45 55 60 60   (Blank rows = not tested)  UPPER EXTREMITY ROM:  WNL  UPPER EXTREMITY MMT:  Eval:  Left shoulder ER and scaption with IR both 3+/5,  all others generally 4+/5 11/25/2022:  Left shoulder ER and scaption with IR both 4-/5,  all others generally 4+/5  CERVICAL SPECIAL TESTS:  Eval:  Spurling's test: Negative   FUNCTIONAL TESTS:  Eval:  5 times sit to stand : 14.06 sec  10/07/2022: 5 times sit to stand:  8.24 sec  TODAY'S TREATMENT:   01/16/2023: UBE level 1.2 x3 min each direction with PT present to discuss status Freemotion shoulder machine in cancer rehab gym: bilateral shoulder extension with eccentric control 7 lbs both 2x10 Freemotion shoulder row with 7 lbs 2 x 10 Freemotion across body "punch" with 7lbs each 2x10 bilat 4D UE ranger x 20 each direction (both) Freemotion lat pull with 7# each 2x10 Open book stretch  2x10 each side Quadruped thoracic rotation "thread the needle"  x 5 each side holding 5 sec each Standing facing wall, lower trap lift offs 2 x 10 Standing snatch and press with 5# kettlebell 2x5 bilat Counter push up 2x10   01/09/2023: NuStep x 5 min (mostly arms) level Freemotion shoulder machine in cancer rehab gym: bilateral shoulder extension with eccentric control 7 lbs both Freemotion shoulder row with 7 lbs 2 x 10 4D UE ranger x 20 each direction (both) UE band walks on back wall: 1 lap along long wall in cancer rehab gym  Supine PROM to cervical spine with upper trap and levator stretch along with sub-occipital release  01/02/2023: UBE level 1.2 x3 min each direction with PT present to discuss status UE band walks on  back wall: 2 laps each direction from finger ladder to steps with green loop Standing lat pulldown 25# 2x10 Standing shoulder flexion and scaption with 2# 2 x 10 Standing facing wall, lower trap lift offs 2 x 10 Quadruped thoracic rotation "thread the needle"  x 5  each side holding 5 sec each Open book stretch  2x10 each side TYI's over red physio ball, feet on floor, ball on mat table with 2# dumbbells.  2x10 Ultrasound in prone to cervical and upper trap region:  3.3 MHz, 50% duty cycle, 1.3 W/cm2 for 8 min    PATIENT EDUCATION:  Education details: Initiated HEP, educated on dry needling and provided handout Person educated: Patient Education method: Consulting civil engineer, Media planner, Verbal cues, and Handouts Education comprehension: verbalized understanding, returned demonstration, and verbal cues required  HOME EXERCISE PROGRAM: Access Code: [email or web address] URL: [email or web address] Date: 11/25/2022 Prepared by: Shelby Dubin Hatice Bubel  Exercises - Seated Upper Trapezius Stretch  - 1 x daily - 7 x weekly - 1 sets - 2 reps - 20 sec hold - Seated Levator Scapulae Stretch  - 1 x daily - 7 x weekly - 1 sets - 2 reps - 20 sec hold - Seated Scapular Retraction  - 1 x daily - 7 x weekly - 3 sets - 10 reps - Shoulder Rolls in Sitting  - 1 x daily - 7 x weekly - 3 sets - 10 reps - Seated Cervical Retraction  - 1 x daily - 7 x weekly - 3 sets - 10 reps - Seated Assisted Cervical Rotation with Towel  - 1 x daily - 7 x weekly - 2 sets - 10 reps - Shoulder extension with resistance - Neutral  - 1 x daily - 7 x weekly - 3 sets - 10 reps - Standing Shoulder Row with Anchored Resistance  - 1 x daily - 7 x weekly - 3 sets - 10 reps - Seated Shoulder Horizontal Abduction with Resistance  - 1 x daily - 7 x weekly - 3 sets - 10 reps - Shoulder External Rotation and Scapular Retraction with Resistance  - 1 x daily - 7 x weekly - 2 sets - 10 reps - Sidelying Shoulder External Rotation  - 2 x daily - 7 x weekly - 2 sets - 10 reps - Single Arm Serratus Punches in Supine with Dumbbell  - 2 x daily - 7 x weekly - 2 sets - 10 reps - Bent Over Single Arm Shoulder Row with Dumbbell  - 1 x daily - 7 x weekly - 2 sets - 10 reps - Single Arm Bent Over Shoulder Extension with  Dumbbell  - 1 x daily - 7 x weekly - 2 sets - 10 reps - Single Arm Bent Over Shoulder Horizontal Abduction with Dumbbell - Palm Down  - 1 x daily - 7 x weekly - 2 sets - 10 reps  ASSESSMENT:  CLINICAL IMPRESSION: Ms Hearld is progressing as anticipated.  Able to progress strengthening and did not end with any manual therapy secondary to anticipated discharge next week and wanting to fully gauge progress.  Patient tolerated session well and without any increased pain, but did report some muscle fatigue.     OBJECTIVE IMPAIRMENTS decreased cognition, decreased ROM, decreased strength, increased fascial restrictions, increased muscle spasms, impaired flexibility, postural dysfunction, and pain.   ACTIVITY LIMITATIONS carrying, lifting, sleeping, transfers, bed mobility, dressing, reach over head, and hygiene/grooming  PARTICIPATION LIMITATIONS: meal prep, cleaning, laundry, driving, shopping, community activity, occupation, and yard work  PERSONAL FACTORS Behavior pattern, Fitness, Past/current experiences, Time since onset of injury/illness/exacerbation, and 1-2 comorbidities: insomnia,  are also affecting patient's functional outcome.   REHAB POTENTIAL: Good  CLINICAL DECISION MAKING: Stable/uncomplicated  EVALUATION COMPLEXITY: Low   GOALS: Goals reviewed with patient? Yes  SHORT TERM GOALS: Target date: 09/10/2022   Patient will be independent with initial HEP  Baseline: na Goal status: MET  2.  Pain report to be no greater than 4/10  Baseline: 6/10  Goal status: MET on 11/25/2022  3.  Patient to be able to fall asleep no later than 10pm and sleep consecutive 4 hours Baseline: na Goal status: MET on 09/12/2022 with pt reporting that she can sleep for 5 hours, pain no longer waking her up at night   LONG TERM GOALS: Target date: 01/24/2023  Patient to be independent with advanced HEP  Baseline: na Goal status: IN PROGRESS  2.  Patient to report pain no greater than 2/10   Baseline: na Goal status: IN PROGRESS  3.  Patient to be able to sleep through the night  Baseline: na Goal status: MET on 09/30/2022  4.  Patient to begin and maintain a consistent exercise regimen Baseline: na Goal status: IN PROGRESS  5.  Cervical ROM to be [facility] Baseline: na Goal status: MET on 10/07/2022  6.  UE and postural strength to be 4+/5 Baseline: na Goal status: IN PROGRESS   PLAN: PT FREQUENCY: 1x/week  PT DURATION: 8 weeks  PLANNED INTERVENTIONS: Therapeutic exercises, Therapeutic activity, Neuromuscular re-education, Balance training, Patient/Family education, Self Care, Joint mobilization, Aquatic Therapy, Dry Needling, Electrical stimulation, Spinal mobilization, Cryotherapy, Moist heat, scar mobilization, Taping, Traction, Ultrasound, Ionotophoresis 4mg /ml Dexamethasone, Manual therapy, and Re-evaluation  PLAN FOR NEXT SESSION: anticipated discharge   Demi Trieu, PT 01/16/23 11:04 AM   [facility] [address] [place], [place] [address] Phone # [phone] Fax [phone]

## 2023-01-23 ENCOUNTER — Ambulatory Visit: Payer: Medicare Other

## 2023-01-23 DIAGNOSIS — M542 Cervicalgia: Secondary | ICD-10-CM | POA: Diagnosis not present

## 2023-01-23 DIAGNOSIS — R252 Cramp and spasm: Secondary | ICD-10-CM | POA: Diagnosis not present

## 2023-01-23 DIAGNOSIS — M6281 Muscle weakness (generalized): Secondary | ICD-10-CM | POA: Diagnosis not present

## 2023-01-23 DIAGNOSIS — R293 Abnormal posture: Secondary | ICD-10-CM

## 2023-01-23 NOTE — Therapy (Signed)
OUTPATIENT PHYSICAL THERAPY TREATMENT NOTE  PHYSICAL THERAPY DISCHARGE SUMMARY  Visits from Start of Care: 28  Current functional level related to goals / functional outcomes: See below   Remaining deficits: See below   Education / Equipment: See below   Patient agrees to discharge. Patient goals were met. Patient is being discharged due to meeting the stated rehab goals.    Patient Name: Charlene Hunt MRN: [id number] DOB:[birth year], 72 y.o., female Today's Date: 01/23/2023      PT End of Session - 01/23/23 1020     Visit Number 28    Date for PT Re-Evaluation 01/24/23    Authorization Type [employer]ICARE    Progress Note Due on Visit 66    PT Start Time 1018    PT Stop Time 1100    PT Time Calculation (min) 42 min    Activity Tolerance Patient tolerated treatment well    Behavior During Therapy WFL for tasks assessed/performed              Past Medical History:  Diagnosis Date   Abnormal finding on thyroid function test    Acid reflux    ADD (attention deficit disorder)    Allergies    Anxiety    Asymptomatic menopausal state    Current every day vaping    Depression    Diverticulosis    Elevated coronary artery calcium score    Endometrial disorder    Erythema nodosum    Family history of breast cancer    Flat feet    GERD (gastroesophageal reflux disease)    H/O menorrhagia    Herpesviral infection of urogenital system    Hypothyroidism    Insomnia    Memory impairment    Migraine    OSA (obstructive sleep apnea)    Osteopenia    Plantar fasciitis    Sleep disorder    Vitamin D deficiency    Past Surgical History:  Procedure Laterality Date   C SECTIONS     CATARACTS     DILATION AND CURETTAGE OF UTERUS     ENDOMETRIAL ABLATION     NASAL SEPTOPLASTY FOR DEVIATED SEPTUM     TONSILLECTOMY     TORN RETINA     [place]     Patient Active Problem List   Diagnosis Date Noted   Cervicalgia 08/13/2022    PCP:  Glenis Smoker, MD  REFERRING PROVIDER: Inez Catalina, MD   REFERRING DIAG: M54.2 (ICD-10-CM) - Cervicalgia   THERAPY DIAG:  Cervicalgia  Muscle weakness (generalized)  Cramp and spasm  Abnormal posture  Rationale for Evaluation and Treatment Rehabilitation  ONSET DATE: 08/08/2022   SUBJECTIVE:  SUBJECTIVE STATEMENT:  Pt reports that she is still taking pain medication for her feet, so she wonders if that is making her neck hurt less. States that she continues to have decreased pain.  PERTINENT HISTORY:  Sleep apnea, insomnia, excessive stress  PAIN:  Are you having pain? Yes: NPRS scale: 1-1.5/10 Pain location: left side of neck and upper trap Pain description: aching Aggravating factors: nothing in particular Relieving factors: Tylenol, heat  PRECAUTIONS: None  WEIGHT BEARING RESTRICTIONS No  FALLS:  Has patient fallen in last 6 months? Yes. Number of falls was carrying her grandson up some steps and he was writhing around.   OCCUPATION: retired Agricultural engineer  PLOF: Independent, Independent with basic ADLs, Independent with household mobility without device, Independent with community mobility without device, Independent with homemaking with ambulation, Independent with gait, and Independent with transfers  PATIENT GOALS   Less pain  OBJECTIVE:   DIAGNOSTIC FINDINGS:  na  PATIENT SURVEYS:  Eval:  FOTO 56 (goal is 60) 09/30/2022:  FOTO 61%    COGNITION: Overall cognitive status: Within functional limits for tasks assessed   SENSATION: WFL Patient reports history of some symptoms associated with carpal tunnel bilaterally and left thumb trigger finger,  also hx of mild left shoulder injury in yoga.   POSTURE: rounded shoulders and forward head  slight kyphosis  PALPATION: Tender, tight bands, trigger points bilateral upper traps, parascapular areas Left > right    CERVICAL ROM:   Active ROM A/PROM (deg) eval A/ROM (Deg) 09/09/22 A/ROM (Deg) 10/07/22 A/ROM (deg) 11/25/22 A/ROM (deg) 11/25/22  Flexion WNL 65 65 70 70  Extension 50 50 55 60 62  Right lateral flexion 32 35 40 40 40  Left lateral flexion 30 35 38 40 42  Right rotation 42 55 60 82 80  Left rotation 45 55 60 60 70   (Blank rows = not tested)  UPPER EXTREMITY ROM:  WNL  UPPER EXTREMITY MMT:  Eval:  Left shoulder ER and scaption with IR both 3+/5,  all others generally 4+/5 11/25/2022:  Left shoulder ER 4+/5 , scaption with IR 4-/5,  all others generally 4+/5  CERVICAL SPECIAL TESTS:  Eval:  Spurling's test: Negative   FUNCTIONAL TESTS:  Eval:  5 times sit to stand : 14.06 sec  10/07/2022: 5 times sit to stand:  8.24 sec  01/23/23: 7.35 sec  TODAY'S TREATMENT:  01/23/23: DC assessment completed Reviewed and updated HEP and directed in DC plan  01/16/2023: UBE level 1.2 x3 min each direction with PT present to discuss status Freemotion shoulder machine in cancer rehab gym: bilateral shoulder extension with eccentric control 7 lbs both 2x10 Freemotion shoulder row with 7 lbs 2 x 10 Freemotion across body "punch" with 7lbs each 2x10 bilat 4D UE ranger x 20 each direction (both) Freemotion lat pull with 7# each 2x10 Open book stretch  2x10 each side Quadruped thoracic rotation "thread the needle"  x 5 each side holding 5 sec each Standing facing wall, lower trap lift offs 2 x 10 Standing snatch and press with 5# kettlebell 2x5 bilat Counter push up 2x10  01/09/2023: NuStep x 5 min (mostly arms) level Freemotion shoulder machine in cancer rehab gym: bilateral shoulder extension with eccentric control 7 lbs both Freemotion shoulder row with 7 lbs 2 x 10 4D UE ranger x 20 each direction (both) UE band walks on back wall: 1 lap along long wall  in cancer rehab gym  Supine PROM to cervical spine with upper  trap and levator stretch along with sub-occipital release   PATIENT EDUCATION:  Education details: Initiated HEP, educated on dry needling and provided handout Person educated: Patient Education method: Consulting civil engineer, Media planner, Verbal cues, and Handouts Education comprehension: verbalized understanding, returned demonstration, and verbal cues required  HOME EXERCISE PROGRAM: Access Code: [email or web address] URL: [email or web address] Date: 11/25/2022 Prepared by: Shelby Dubin Menke  Exercises - Seated Upper Trapezius Stretch  - 1 x daily - 7 x weekly - 1 sets - 2 reps - 20 sec hold - Seated Levator Scapulae Stretch  - 1 x daily - 7 x weekly - 1 sets - 2 reps - 20 sec hold - Seated Scapular Retraction  - 1 x daily - 7 x weekly - 3 sets - 10 reps - Shoulder Rolls in Sitting  - 1 x daily - 7 x weekly - 3 sets - 10 reps - Seated Cervical Retraction  - 1 x daily - 7 x weekly - 3 sets - 10 reps - Seated Assisted Cervical Rotation with Towel  - 1 x daily - 7 x weekly - 2 sets - 10 reps - Shoulder extension with resistance - Neutral  - 1 x daily - 7 x weekly - 3 sets - 10 reps - Standing Shoulder Row with Anchored Resistance  - 1 x daily - 7 x weekly - 3 sets - 10 reps - Seated Shoulder Horizontal Abduction with Resistance  - 1 x daily - 7 x weekly - 3 sets - 10 reps - Shoulder External Rotation and Scapular Retraction with Resistance  - 1 x daily - 7 x weekly - 2 sets - 10 reps - Sidelying Shoulder External Rotation  - 2 x daily - 7 x weekly - 2 sets - 10 reps - Single Arm Serratus Punches in Supine with Dumbbell  - 2 x daily - 7 x weekly - 2 sets - 10 reps - Bent Over Single Arm Shoulder Row with Dumbbell  - 1 x daily - 7 x weekly - 2 sets - 10 reps - Single Arm Bent Over Shoulder Extension with Dumbbell  - 1 x daily - 7 x weekly - 2 sets - 10 reps - Single Arm Bent Over Shoulder Horizontal Abduction with Dumbbell - Palm Down  -  1 x daily - 7 x weekly - 2 sets - 10 reps  ASSESSMENT:  CLINICAL IMPRESSION: Ms Jin met all goals and is doing her HEP consistently.  She continues to have some occasional pain in the left upper trap and levator scap.  She understands this is common with general OA of the c spine and possibly some slight deficits in the left rotator cuff and that continuing her exercises will be critical to avoid exacerbation of symptoms.       OBJECTIVE IMPAIRMENTS decreased cognition, decreased ROM, decreased strength, increased fascial restrictions, increased muscle spasms, impaired flexibility, postural dysfunction, and pain.   ACTIVITY LIMITATIONS carrying, lifting, sleeping, transfers, bed mobility, dressing, reach over head, and hygiene/grooming  PARTICIPATION LIMITATIONS: meal prep, cleaning, laundry, driving, shopping, community activity, occupation, and yard work  PERSONAL FACTORS Behavior pattern, Fitness, Past/current experiences, Time since onset of injury/illness/exacerbation, and 1-2 comorbidities: insomnia,  are also affecting patient's functional outcome.   REHAB POTENTIAL: Good  CLINICAL DECISION MAKING: Stable/uncomplicated  EVALUATION COMPLEXITY: Low   GOALS: Goals reviewed with patient? Yes  SHORT TERM GOALS: Target date: 09/10/2022   Patient will be independent with initial HEP  Baseline: na Goal status: MET  2.  Pain report to  be no greater than 4/10  Baseline: 6/10  Goal status: MET on 11/25/2022  3.  Patient to be able to fall asleep no later than 10pm and sleep consecutive 4 hours Baseline: na Goal status: MET on 09/12/2022 with pt reporting that she can sleep for 5 hours, pain no longer waking her up at night   LONG TERM GOALS: Target date: 01/24/2023  Patient to be independent with advanced HEP  Baseline: na Goal status: MET  2.  Patient to report pain no greater than 2/10  Baseline: na Goal status: MET  3.  Patient to be able to sleep through the night   Baseline: na Goal status: MET on 09/30/2022  4.  Patient to begin and maintain a consistent exercise regimen Baseline: na Goal status: MET  5.  Cervical ROM to be [facility] Baseline: na Goal status: MET on 10/07/2022  6.  UE and postural strength to be 4+/5 Baseline: na Goal status: MET   PLAN: PT FREQUENCY: 1x/week  PT DURATION: 8 weeks  PLANNED INTERVENTIONS: Therapeutic exercises, Therapeutic activity, Neuromuscular re-education, Balance training, Patient/Family education, Self Care, Joint mobilization, Aquatic Therapy, Dry Needling, Electrical stimulation, Spinal mobilization, Cryotherapy, Moist heat, scar mobilization, Taping, Traction, Ultrasound, Ionotophoresis 4mg /ml Dexamethasone, Manual therapy, and Re-evaluation  PLAN FOR NEXT SESSION: DC   Terre Hanneman B. Tryce Surratt, PT 01/23/23 12:10 PM  [facility] [address] [place], [place] [address] Phone # [phone] Fax [phone]

## 2023-02-03 ENCOUNTER — Encounter: Payer: Self-pay | Admitting: Podiatry

## 2023-02-03 ENCOUNTER — Ambulatory Visit: Payer: Medicare Other | Admitting: Podiatry

## 2023-02-03 DIAGNOSIS — M778 Other enthesopathies, not elsewhere classified: Secondary | ICD-10-CM

## 2023-02-03 DIAGNOSIS — M722 Plantar fascial fibromatosis: Secondary | ICD-10-CM | POA: Diagnosis not present

## 2023-02-03 MED ORDER — TRIAMCINOLONE ACETONIDE 40 MG/ML IJ SUSP
20.0000 mg | Freq: Once | INTRAMUSCULAR | Status: AC
  Administered 2023-02-03: 20 mg

## 2023-02-03 NOTE — Patient Instructions (Signed)

## 2023-02-03 NOTE — Progress Notes (Signed)
She presents today for follow-up of her Planter fasciitis left.  States that they are much better but she still has a little bit of pain to the left heel.  States that the lateral aspect of the foot is tremendously better.  Objective: Vital signs stable she is alert and oriented x 3.  Pulses are palpable.  She has pain on palpation medial calcaneal tubercle of the left heel.  Assessment: Planter fasciitis 85% resolved.  Plan: Reinjected left heel today continue use of the meloxicam follow-up with me in 6 weeks if necessary.

## 2023-03-13 ENCOUNTER — Encounter: Payer: Self-pay | Admitting: Podiatry

## 2023-03-13 ENCOUNTER — Ambulatory Visit: Payer: Medicare Other | Admitting: Podiatry

## 2023-03-13 DIAGNOSIS — M722 Plantar fascial fibromatosis: Secondary | ICD-10-CM | POA: Diagnosis not present

## 2023-03-13 MED ORDER — TRIAMCINOLONE ACETONIDE 40 MG/ML IJ SUSP
20.0000 mg | Freq: Once | INTRAMUSCULAR | Status: AC
  Administered 2023-03-13: 20 mg

## 2023-03-13 MED ORDER — MELOXICAM 15 MG PO TABS: 15.0000 mg | ORAL_TABLET | Freq: Every day | ORAL | 3 refills | Status: DC

## 2023-03-13 NOTE — Progress Notes (Signed)
She presents today states that that she was doing much better almost 100% she stopped taking her meloxicam and started to come back.  She is referring to the plantar fasciitis of her left foot.  Objective: Vital signs stable alert oriented x 3.  Pulses are palpable.  There is no erythema edema salines drainage odor she has pain in the central band of the plantar fascia at the central calcaneal tubercle.  Assessment: Central band Planter fasciitis.  Plan: I injected the area today 20 mg Kenalog milligrams Marcaine point maximal tenderness and started her back on her meloxicam.

## 2023-04-07 DIAGNOSIS — S8010XA Contusion of unspecified lower leg, initial encounter: Secondary | ICD-10-CM | POA: Diagnosis not present

## 2023-04-22 LAB — GLUCOSE, POCT (MANUAL RESULT ENTRY): Glucose Fasting, POC: 86 mg/dL (ref 70–99)

## 2023-05-03 DIAGNOSIS — J029 Acute pharyngitis, unspecified: Secondary | ICD-10-CM | POA: Diagnosis not present

## 2023-05-03 DIAGNOSIS — U071 COVID-19: Secondary | ICD-10-CM | POA: Diagnosis not present

## 2023-05-03 DIAGNOSIS — J018 Other acute sinusitis: Secondary | ICD-10-CM | POA: Diagnosis not present

## 2023-06-16 DIAGNOSIS — R509 Fever, unspecified: Secondary | ICD-10-CM | POA: Diagnosis not present

## 2023-06-16 DIAGNOSIS — Z03818 Encounter for observation for suspected exposure to other biological agents ruled out: Secondary | ICD-10-CM | POA: Diagnosis not present

## 2023-06-16 DIAGNOSIS — J029 Acute pharyngitis, unspecified: Secondary | ICD-10-CM | POA: Diagnosis not present

## 2023-06-16 DIAGNOSIS — R051 Acute cough: Secondary | ICD-10-CM | POA: Diagnosis not present

## 2023-06-17 DIAGNOSIS — J0110 Acute frontal sinusitis, unspecified: Secondary | ICD-10-CM | POA: Diagnosis not present

## 2023-06-25 DIAGNOSIS — R233 Spontaneous ecchymoses: Secondary | ICD-10-CM | POA: Diagnosis not present

## 2023-06-25 DIAGNOSIS — R21 Rash and other nonspecific skin eruption: Secondary | ICD-10-CM | POA: Diagnosis not present

## 2023-07-02 DIAGNOSIS — Z23 Encounter for immunization: Secondary | ICD-10-CM | POA: Diagnosis not present

## 2023-07-02 DIAGNOSIS — K219 Gastro-esophageal reflux disease without esophagitis: Secondary | ICD-10-CM | POA: Diagnosis not present

## 2023-07-02 DIAGNOSIS — I10 Essential (primary) hypertension: Secondary | ICD-10-CM | POA: Diagnosis not present

## 2023-07-02 DIAGNOSIS — Z0000 Encounter for general adult medical examination without abnormal findings: Secondary | ICD-10-CM | POA: Diagnosis not present

## 2023-07-02 DIAGNOSIS — Z720 Tobacco use: Secondary | ICD-10-CM | POA: Diagnosis not present

## 2023-07-02 DIAGNOSIS — Z1211 Encounter for screening for malignant neoplasm of colon: Secondary | ICD-10-CM | POA: Diagnosis not present

## 2023-07-02 DIAGNOSIS — E559 Vitamin D deficiency, unspecified: Secondary | ICD-10-CM | POA: Diagnosis not present

## 2023-07-02 DIAGNOSIS — E7800 Pure hypercholesterolemia, unspecified: Secondary | ICD-10-CM | POA: Diagnosis not present

## 2023-07-02 DIAGNOSIS — E039 Hypothyroidism, unspecified: Secondary | ICD-10-CM | POA: Diagnosis not present

## 2023-07-02 DIAGNOSIS — Z79899 Other long term (current) drug therapy: Secondary | ICD-10-CM | POA: Diagnosis not present

## 2023-07-07 ENCOUNTER — Other Ambulatory Visit: Payer: Self-pay | Admitting: Family Medicine

## 2023-07-07 DIAGNOSIS — E2839 Other primary ovarian failure: Secondary | ICD-10-CM

## 2023-07-22 DIAGNOSIS — H33302 Unspecified retinal break, left eye: Secondary | ICD-10-CM | POA: Diagnosis not present

## 2023-09-10 ENCOUNTER — Other Ambulatory Visit: Payer: Self-pay | Admitting: Podiatry

## 2023-09-19 ENCOUNTER — Ambulatory Visit: Payer: Medicare Other | Admitting: Physician Assistant

## 2023-09-19 ENCOUNTER — Encounter: Payer: Self-pay | Admitting: Physician Assistant

## 2023-09-19 VITALS — BP 130/83 | HR 89 | Ht 60.0 in | Wt 169.0 lb

## 2023-09-19 DIAGNOSIS — F419 Anxiety disorder, unspecified: Secondary | ICD-10-CM

## 2023-09-19 DIAGNOSIS — G4700 Insomnia, unspecified: Secondary | ICD-10-CM | POA: Insufficient documentation

## 2023-09-19 DIAGNOSIS — G4733 Obstructive sleep apnea (adult) (pediatric): Secondary | ICD-10-CM

## 2023-09-19 DIAGNOSIS — F331 Major depressive disorder, recurrent, moderate: Secondary | ICD-10-CM

## 2023-09-19 DIAGNOSIS — F900 Attention-deficit hyperactivity disorder, predominantly inattentive type: Secondary | ICD-10-CM

## 2023-09-19 DIAGNOSIS — Z7289 Other problems related to lifestyle: Secondary | ICD-10-CM | POA: Insufficient documentation

## 2023-09-19 DIAGNOSIS — F5105 Insomnia due to other mental disorder: Secondary | ICD-10-CM | POA: Diagnosis not present

## 2023-09-19 DIAGNOSIS — F32A Depression, unspecified: Secondary | ICD-10-CM

## 2023-09-19 DIAGNOSIS — F99 Mental disorder, not otherwise specified: Secondary | ICD-10-CM

## 2023-09-19 DIAGNOSIS — F988 Other specified behavioral and emotional disorders with onset usually occurring in childhood and adolescence: Secondary | ICD-10-CM | POA: Insufficient documentation

## 2023-09-19 MED ORDER — ATOMOXETINE HCL 25 MG PO CAPS: 25.0000 mg | ORAL_CAPSULE | Freq: Every morning | ORAL | 1 refills | Status: DC

## 2023-09-19 NOTE — Progress Notes (Unsigned)
[facility] MD/PA/NP Initial Note  09/19/2023 8:57 AM Charlene Hunt  MRN:  [id number]  Chief Complaint:  Chief Complaint   Establish Care    HPI:  Saw psych in [place]. Off cymbalta for 13 or so years. Ins wouldn't pay for brand and generic didn't work. Same for Adderall.  Depression for years. Moved to [place] 72 yo from [place].    Doesn't get along with her son-in-law who lives in [place]. That's affected the relationship with her daughter. Going there for 2 months in Jan when dtr is having a baby.   Some ruminating thoughts.  Hard to concentrate, distracted easily. Spends money on jewelry supplies, one of her hobbies. Reports increased irritability at times but feels like it's warranted.  Denies decreased need for sleep, increased talkativeness, impulsivity or risky behaviors, increased libido, grandiosity, paranoia, or hallucinations.    Visit Diagnosis:    ICD-10-CM   1. Attention deficit hyperactivity disorder (ADHD), predominantly inattentive type  F90.0     2. Anxiety  F41.9     3. Current every day vaping  Z72.89     4. Moderate episode of recurrent major depressive disorder (HCC)  F33.1     5. Insomnia due to other mental disorder  F51.05    F99     6. OSA (obstructive sleep apnea)  G47.33       Past Psychiatric History:   Past medications for mental health diagnoses include: Cymbalta helped, Zoloft caused hives on her neck, Prozac made her 'crazy.' Wellbutrin made her tired. Adderall.   Had IOP years ago. No hospitalization for mental health reasons.   No h/o cutting or other self-harm. No suicide attempts.   Past Medical History:  Past Medical History:  Diagnosis Date   Abnormal finding on thyroid function test    Acid reflux    ADD (attention deficit disorder)    Allergies    Anxiety    Asymptomatic menopausal state    Current every day vaping    Depression    Diverticulosis    Elevated coronary artery calcium score    Endometrial  disorder    Erythema nodosum    Family history of breast cancer    Flat feet    GERD (gastroesophageal reflux disease)    H/O menorrhagia    Herpesviral infection of urogenital system    Hypothyroidism    Insomnia    Memory impairment    Migraine    OSA (obstructive sleep apnea)    Osteopenia    Plantar fasciitis    Sleep disorder    Vitamin D deficiency     Past Surgical History:  Procedure Laterality Date   C SECTIONS     CATARACTS     DILATION AND CURETTAGE OF UTERUS     ENDOMETRIAL ABLATION     NASAL SEPTOPLASTY FOR DEVIATED SEPTUM     TONSILLECTOMY     TORN RETINA     WISDOMTEETH     Family Psychiatric History:   Family History:  Family History  Problem Relation Age of Onset   Heart Problems Mother    Dementia Mother    Osteoporosis Mother    Heart Problems Father    Breast cancer Sister        half sister   Schizophrenia Brother    Bipolar disorder Brother    Pancreatic cancer Brother    Breast cancer Half-Sister    Depression Daughter    Depression Daughter    Behavior problems Son  Social History:  Social History   Socioeconomic History   Marital status: Divorced    Spouse name: Not on file   Number of children: Not on file   Years of education: Not on file   Highest education level: Bachelor's degree (e.g., BA, AB, BS)  Occupational History   Not on file  Tobacco Use   Smoking status: Every Day    Types: E-cigarettes   Smokeless tobacco: Never   Tobacco comments:    Pt reports that she vapes  Substance and Sexual Activity   Alcohol use: Not Currently    Alcohol/week: 0.0 - 1.0 standard drinks of alcohol    Comment: does drink often   Drug use: Not Currently    Types: Marijuana   Sexual activity: Not on file  Other Topics Concern   Not on file  Social History Narrative   From [place] but lived in 67 different states, moved here  to follow one of her daughters.   Divorced for 30+ years.    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       Hobbies-makes jewelry, goes to groups at church.      Caffeine-1 latte   [employer]   Social Determinants of Health   Financial Resource Strain: Low Risk  (09/19/2023)   Overall Financial Resource Strain (CARDIA)    Difficulty of Paying Living Expenses: Not very hard  Food Insecurity: No Food Insecurity (09/19/2023)   Hunger Vital Sign    Worried About Running Out of Food in the Last Year: Never true    Ran Out of Food in the Last Year: Never true  Transportation Needs: No Transportation Needs (09/19/2023)   PRAPARE - Administrator, Civil Service (Medical): No    Lack of Transportation (Non-Medical): No  Physical Activity: Insufficiently Active (09/19/2023)   Exercise Vital Sign    Days of Exercise per Week: 2 days    Minutes of Exercise per Session: 40 min  Stress: Stress Concern Present (09/19/2023)   [employer] of Occupational Health - Occupational Stress Questionnaire    Feeling of Stress : To some extent  Social Connections: Moderately Integrated (09/19/2023)   Social Connection and Isolation Panel [NHANES]    Frequency of Communication with Friends and Family: More than three times a week    Frequency of Social Gatherings with Friends and Family: More than three times a week    Attends Religious Services: More than 4 times per year    Active Member of [employer] or Organizations: Yes    Attends Engineer, structural: More than 4 times per year    Marital Status: Divorced    Allergies:  Allergies  Allergen Reactions   Ciprofloxacin    Fish Allergy    Penicillins Hives    Metabolic Disorder Labs: Lab Results  Component Value Date   HGBA1C 5.6 10/08/2022   No results found for: "PROLACTIN" No results found for: "CHOL", "TRIG", "HDL", "CHOLHDL", "VLDL", "LDLCALC" No results found for: "TSH"  Therapeutic Level Labs: No results found for: "LITHIUM" No results found for: "VALPROATE" No results  found for: "CBMZ"  Current Medications: Current Outpatient Medications  Medication Sig Dispense Refill   albuterol (VENTOLIN HFA) 108 (90 Base) MCG/ACT inhaler Inhale into the lungs every 6 (six) hours as needed for wheezing or shortness of breath.     atomoxetine (STRATTERA) 25 MG capsule Take 1 capsule (25 mg total) by mouth in the  morning. 30 capsule 1   fluticasone (FLONASE) 50 MCG/ACT nasal spray Place 1 spray into both nostrils at bedtime.     loratadine (CLARITIN) 10 MG tablet Take 10 mg by mouth daily as needed for allergies.     meloxicam (MOBIC) 15 MG tablet TAKE 1 TABLET (15 MG TOTAL) BY MOUTH DAILY. 30 tablet 3   omeprazole (PRILOSEC) 10 MG capsule Take 10 mg by mouth at bedtime. Sinus issues/heart burn     rosuvastatin (CRESTOR) 10 MG tablet Take 10 mg by mouth daily.     No current facility-administered medications for this visit.    Medication Side Effects: {Medication Side Effects (Optional):12147}  Orders placed this visit:  No orders of the defined types were placed in this encounter.   Psychiatric Specialty Exam:  Review of Systems  Constitutional:  Positive for fatigue.  HENT:  Positive for tinnitus.   Eyes: Negative.   Respiratory: Negative.    Cardiovascular: Negative.   Gastrointestinal:        GERD  Endocrine: Negative.   Genitourinary:  Positive for urgency.  Musculoskeletal:  Positive for back pain, myalgias and neck pain.  Skin: Negative.   Allergic/Immunologic: Negative.   Neurological:  Positive for headaches.  Hematological: Negative.   Psychiatric/Behavioral:         See HPI    Blood pressure 130/83, pulse 89, height 5' (1.524 m), weight 169 lb (76.7 kg).Body mass index is 33.01 kg/m.  General Appearance: Casual and Well Groomed  Eye Contact:  Good  Speech:  Clear and Coherent and Normal Rate  Volume:  Normal  Mood:  Depressed  Affect:  Depressed  Thought Process:  Goal Directed and Descriptions of Associations: Circumstantial   Orientation:  Full (Time, Place, and Person)  Thought Content: Logical   Suicidal Thoughts:  No  Homicidal Thoughts:  No  Memory:  WNL  Judgement:  Good  Insight:  Good  Psychomotor Activity:  Normal  Concentration:  Concentration: Fair and Attention Span: Fair  Recall:  Good  Fund of Knowledge: Good  Language: Good  Assets:  Desire for Improvement Financial Resources/Insurance Housing Transportation  ADL's:  Intact  Cognition: WNL  Prognosis:  Good   Screenings:  Mini-Mental    Flowsheet Row Office Visit from 09/10/2021 in [facility]  Total Score (max 30 points ) 25      PHQ2-9    Flowsheet Row Office Visit from 09/19/2023 in [facility]  PHQ-2 Total Score 0      Receiving Psychotherapy: No   Treatment Plan/Recommendations:  PDMP reviewed. No controlled substances.   Ok to refer to Carilyn Goodpasture if needed.   Vaping cessation discussed.     Melony Overly, PA-C

## 2023-09-19 NOTE — Patient Instructions (Signed)
Call for an appointment with Carilyn Goodpasture, PA-C at [facility].  If you end up needing a referral, call our office and we'll refer you.   Use GoodRx for the Strattera IF the cost using your insurance will be more than $20

## 2023-11-03 ENCOUNTER — Ambulatory Visit: Payer: Medicare Other | Admitting: Physician Assistant

## 2023-11-03 ENCOUNTER — Encounter: Payer: Self-pay | Admitting: Physician Assistant

## 2023-11-03 DIAGNOSIS — F32A Depression, unspecified: Secondary | ICD-10-CM

## 2023-11-03 DIAGNOSIS — F900 Attention-deficit hyperactivity disorder, predominantly inattentive type: Secondary | ICD-10-CM | POA: Diagnosis not present

## 2023-11-03 DIAGNOSIS — F419 Anxiety disorder, unspecified: Secondary | ICD-10-CM

## 2023-11-03 MED ORDER — ATOMOXETINE HCL 40 MG PO CAPS: 40.0000 mg | ORAL_CAPSULE | Freq: Every day | ORAL | 1 refills | Status: DC

## 2023-11-03 NOTE — Progress Notes (Signed)
 [facility] Med Check  Patient ID: Charlene Hunt,  MRN: [id number]  PCP: Chrystal Lamarr RAMAN, MD  Date of Evaluation: 11/03/2023 Time spent:20 minutes  Chief Complaint:  Chief Complaint   ADHD     HISTORY/CURRENT STATUS: HPI For routine med check  We started Strattera  at the last visit.  She is approximately 50% better.  She is more able to concentrate and does not get distracted as easily.  She is now able to complete tasks, at least better than she was.  Patient is able to enjoy things.  Energy and motivation are good.  She is leaving in 10 days for or a gun.  She will be there 2 months to help her daughter and son-in-law when her daughter has her baby.   No extreme sadness, tearfulness, or feelings of hopelessness.  Sleeps well most of the time. ADLs and personal hygiene are normal.  No change in memory.  Appetite has not changed.  Weight is stable.  No complaints of anxiety.  Denies suicidal or homicidal thoughts.  Patient denies increased energy with decreased need for sleep, increased talkativeness, racing thoughts, impulsivity or risky behaviors, increased spending, increased libido, grandiosity, increased irritability or anger, paranoia, or hallucinations.  Denies dizziness, syncope, seizures, numbness, tingling, tremor, tics, unsteady gait, slurred speech, confusion. Denies muscle or joint pain, stiffness, or dystonia.  Individual Medical History/ Review of Systems: Changes? :No   Past medications for mental health diagnoses include: Cymbalta helped, Zoloft caused hives on her neck, Prozac made her 'crazy.' Wellbutrin made her tired. Adderall.   Had IOP years ago. No hospitalization for mental health reasons.   Allergies: Ciprofloxacin, Fish allergy, and Penicillins  Current Medications:  Current Outpatient Medications:    albuterol (VENTOLIN HFA) 108 (90 Base) MCG/ACT inhaler, Inhale into the lungs every 6 (six) hours as needed for wheezing or shortness of  breath., Disp: , Rfl:    atomoxetine  (STRATTERA ) 40 MG capsule, Take 1 capsule (40 mg total) by mouth daily., Disp: 30 capsule, Rfl: 1   fluticasone (FLONASE) 50 MCG/ACT nasal spray, Place 1 spray into both nostrils at bedtime., Disp: , Rfl:    loratadine (CLARITIN) 10 MG tablet, Take 10 mg by mouth daily as needed for allergies., Disp: , Rfl:    meloxicam  (MOBIC ) 15 MG tablet, TAKE 1 TABLET (15 MG TOTAL) BY MOUTH DAILY., Disp: 30 tablet, Rfl: 3   omeprazole (PRILOSEC) 10 MG capsule, Take 10 mg by mouth at bedtime. Sinus issues/heart burn, Disp: , Rfl:    rosuvastatin (CRESTOR) 10 MG tablet, Take 10 mg by mouth daily., Disp: , Rfl:  Medication Side Effects: none  Family Medical/ Social History: Changes? No  MENTAL HEALTH EXAM:  There were no vitals taken for this visit.There is no height or weight on file to calculate BMI.  General Appearance: Casual and Well Groomed  Eye Contact:  Good  Speech:  Clear and Coherent and Normal Rate  Volume:  Normal  Mood:  Euthymic  Affect:  Congruent  Thought Process:  Goal Directed and Descriptions of Associations: Circumstantial  Orientation:  Full (Time, Place, and Person)  Thought Content: Logical   Suicidal Thoughts:  No  Homicidal Thoughts:  No  Memory:  WNL  Judgement:  Good  Insight:  Good  Psychomotor Activity:  Normal  Concentration:  Concentration: Good and Attention Span: Fair  Recall:  Good  Fund of Knowledge: Good  Language: Good  Assets:  Communication Skills Desire for Improvement Financial Resources/Insurance Housing Transportation  ADL's:  Intact  Cognition: WNL  Prognosis:  Good   DIAGNOSES:    ICD-10-CM   1. Attention deficit hyperactivity disorder (ADHD), predominantly inattentive type  F90.0     2. Anxiety  F41.9     3. Mild depression  F32.A       Receiving Psychotherapy: No   RECOMMENDATIONS:   PDMP reviewed. No controlled substances.  I provided 20 minutes of face to face time during this encounter,  including time spent before and after the visit in records review, medical decision making, counseling pertinent to today's visit, and charting.   I am glad to see her doing better!  I do recommend increasing the Strattera .  I think she can have even more improvement.  She would like to try it.  After she has been on it for about 3 weeks she will call to let me know how well it is working.  I can then send in a 56-month supply to a pharmacy in [place] , if it is working well.  If we need to go up in strength or down, she should let me know that as well.  She understands.  Increase Strattera  to 40 mg daily. Starts therapy with Cato Sprang, [facility] next week. Return in 3 months.  Verneita Cooks, PA-C

## 2023-11-10 ENCOUNTER — Other Ambulatory Visit: Payer: Self-pay | Admitting: Family Medicine

## 2023-11-10 DIAGNOSIS — Z1231 Encounter for screening mammogram for malignant neoplasm of breast: Secondary | ICD-10-CM

## 2023-11-11 ENCOUNTER — Encounter: Payer: Self-pay | Admitting: Professional Counselor

## 2023-11-11 ENCOUNTER — Ambulatory Visit: Payer: Medicare Other | Admitting: Professional Counselor

## 2023-11-11 DIAGNOSIS — F331 Major depressive disorder, recurrent, moderate: Secondary | ICD-10-CM | POA: Diagnosis not present

## 2023-11-11 DIAGNOSIS — F411 Generalized anxiety disorder: Secondary | ICD-10-CM
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
## 2023-11-24 ENCOUNTER — Telehealth: Payer: Self-pay | Admitting: Physician Assistant

## 2023-11-24 NOTE — Telephone Encounter (Signed)
I called pharmacy, they are currently closed. Will call back.

## 2023-11-24 NOTE — Telephone Encounter (Signed)
Charlene Hunt called 4:00 to report that she her Strattera prescription transferred to [place].  It is working for her,  She won't need another refill until the end of February but wanted you to know that the cost was the full price.  She doesn't know if it was because she got the new dose too soon, or if the insurance changed for the new year or if it needs a PA for the new year.  She shouldn't have been charge full price for getting it too soon because it was a new dose.  Insurance could be different due to the new medicare coverage policy.  But please do check to see if a PA is needed so she will know next time that is not the problem with the cost.  The pharmacy in [place] is [facility] at [address], [place], [place] [address], [phone]

## 2023-11-25 NOTE — Telephone Encounter (Signed)
Noted. I will submit a PA

## 2023-12-10 ENCOUNTER — Telehealth: Payer: Self-pay

## 2023-12-10 NOTE — Telephone Encounter (Signed)
Received message pt needed PA for Strattera generic and [facility] stated its a covered medication no PA needed.

## 2024-01-01 ENCOUNTER — Telehealth: Payer: Self-pay | Admitting: Physician Assistant

## 2024-01-01 MED ORDER — ATOMOXETINE HCL 40 MG PO CAPS: 40.0000 mg | ORAL_CAPSULE | Freq: Every day | ORAL | 0 refills | Status: DC

## 2024-01-01 NOTE — Telephone Encounter (Signed)
 Next appt is 01/16/24. Charlene Hunt is in [place], [place] for a week taking care of a family member and left her Straterra 40 mg at home. She will be leaving this coming Wednesday to go home. Could she possibly get a few pills to last until she leaves to go home. Pharmacy is [facility], [address], [place], [place]. Phone number is [phone]. Store # is [id number].

## 2024-01-01 NOTE — Telephone Encounter (Signed)
 A 7-day supply of Strattera sent to the requested pharmacy.

## 2024-01-01 NOTE — Telephone Encounter (Signed)
 Previous msg said she needed a short supply. I resent Rx for a 30-day supply.

## 2024-01-01 NOTE — Telephone Encounter (Signed)
 Charlene Hunt called at 5:00 to ask why she only got a 7 day supply.  She hasn't picked it up yet. She didn't leave her Strattera at home. She brought with her what she had.  She will be out Saturday and would like to be able to pick up her full month supply so she doesn't have to get another prescription when she gets home.  Please correct the prescription to be a full 30 day supply.

## 2024-01-14 ENCOUNTER — Ambulatory Visit
Admission: RE | Admit: 2024-01-14 | Discharge: 2024-01-14 | Disposition: A | Discharge status: Home / Self Care | Payer: Medicare Other | Source: Ambulatory Visit | Attending: Family Medicine | Admitting: Family Medicine

## 2024-01-14 DIAGNOSIS — Z1231 Encounter for screening mammogram for malignant neoplasm of breast: Secondary | ICD-10-CM

## 2024-01-14 DIAGNOSIS — E2839 Other primary ovarian failure: Secondary | ICD-10-CM

## 2024-01-14 DIAGNOSIS — M8588 Other specified disorders of bone density and structure, other site: Secondary | ICD-10-CM | POA: Diagnosis not present

## 2024-01-15 ENCOUNTER — Telehealth: Payer: Self-pay | Admitting: Physician Assistant

## 2024-01-15 MED ORDER — ATOMOXETINE HCL 40 MG PO CAPS: 40.0000 mg | ORAL_CAPSULE | Freq: Every day | ORAL | 0 refills | Status: DC

## 2024-01-15 NOTE — Telephone Encounter (Signed)
 I called pt to reschedule app with Rosey Bath due to her being out sick tomorrow.  Pt asked for refill of Strattera 40mg .  She came back from [place] on 3/12.  She still has some 25mg , but needs to take the 40mg .  Send to [facility].  Next appr 5/16

## 2024-01-15 NOTE — Telephone Encounter (Signed)
 Pt reports she did not pick up RF for Strattera #30 in [place]. Told her I could send her a RF to [facility].  She said she keeps getting texts from [place] to pick up Rx. Called [facility] and canceled Rx there.  Sent RF to [facility].

## 2024-01-16 ENCOUNTER — Ambulatory Visit: Payer: Medicare Other | Admitting: Physician Assistant

## 2024-01-19 ENCOUNTER — Ambulatory Visit: Payer: Medicare Other | Admitting: Professional Counselor

## 2024-01-19 ENCOUNTER — Encounter: Payer: Self-pay | Admitting: Professional Counselor

## 2024-01-19 DIAGNOSIS — F330 Major depressive disorder, recurrent, mild: Secondary | ICD-10-CM

## 2024-01-19 DIAGNOSIS — F411 Generalized anxiety disorder: Secondary | ICD-10-CM
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
## 2024-01-21 DIAGNOSIS — Z1211 Encounter for screening for malignant neoplasm of colon: Secondary | ICD-10-CM | POA: Diagnosis not present

## 2024-01-22 DIAGNOSIS — G4733 Obstructive sleep apnea (adult) (pediatric): Secondary | ICD-10-CM | POA: Diagnosis not present

## 2024-01-22 DIAGNOSIS — M8589 Other specified disorders of bone density and structure, multiple sites: Secondary | ICD-10-CM | POA: Diagnosis not present

## 2024-01-22 DIAGNOSIS — E7800 Pure hypercholesterolemia, unspecified: Secondary | ICD-10-CM | POA: Diagnosis not present

## 2024-01-22 DIAGNOSIS — R2243 Localized swelling, mass and lump, lower limb, bilateral: Secondary | ICD-10-CM | POA: Diagnosis not present

## 2024-01-25 DIAGNOSIS — R509 Fever, unspecified: Secondary | ICD-10-CM | POA: Diagnosis not present

## 2024-01-25 DIAGNOSIS — R051 Acute cough: Secondary | ICD-10-CM | POA: Diagnosis not present

## 2024-01-25 DIAGNOSIS — J069 Acute upper respiratory infection, unspecified: Secondary | ICD-10-CM | POA: Diagnosis not present

## 2024-02-03 ENCOUNTER — Ambulatory Visit: Admitting: Physician Assistant

## 2024-02-03 ENCOUNTER — Encounter: Payer: Self-pay | Admitting: Physician Assistant

## 2024-02-03 DIAGNOSIS — F900 Attention-deficit hyperactivity disorder, predominantly inattentive type: Secondary | ICD-10-CM

## 2024-02-03 DIAGNOSIS — Z6379 Other stressful life events affecting family and household: Secondary | ICD-10-CM | POA: Diagnosis not present

## 2024-02-03 DIAGNOSIS — F4323 Adjustment disorder with mixed anxiety and depressed mood: Secondary | ICD-10-CM

## 2024-02-03 MED ORDER — ATOMOXETINE HCL 40 MG PO CAPS: 40.0000 mg | ORAL_CAPSULE | Freq: Every day | ORAL | 0 refills | Status: DC

## 2024-02-03 NOTE — Progress Notes (Signed)
 [facility] Med Check  Patient ID: Charlene Hunt,  MRN: [id number]  PCP: Ransom Byers, MD  Date of Evaluation: 02/03/2024 Time spent:20 minutes  Chief Complaint:  Chief Complaint   ADD; Follow-up    HISTORY/CURRENT STATUS: HPI For routine med check  Was in [place]  for 2 months, planned visit when her dtr had a baby. Her grandson has a rare heart condition and tracheal narrowing and had surgery last week. She saw him in distress when she was there so it's been very scary. He's healing ok. Still hospitalized. She understandably anxious about it.   The Strattera is helping, but it makes her dizzy after she takes it. It's hard to say how much its helping right now though b/c of all the stress.  Energy and motivation are good.   No feelings of hopelessness.  Sleeps fairly well most of the time. ADLs and personal hygiene are normal.  Appetite has not changed.  Weight is stable.   Denies suicidal or homicidal thoughts.  Strattera is still helpful. Feels like it might be even better if the dose was increased but under the circumstances she doesn't want to change it now.  She'll be going back to [place]  soon and plans to stay another few months, as long as is needed.   Denies dizziness, syncope, seizures, numbness, tingling, tremor, tics, unsteady gait, slurred speech, confusion. Denies muscle or joint pain, stiffness, or dystonia.  Individual Medical History/ Review of Systems: Changes? :No   Past medications for mental health diagnoses include: Cymbalta helped, Zoloft caused hives on her neck, Prozac made her 'crazy.' Wellbutrin made her tired. Adderall.   Had IOP years ago. No hospitalization for mental health reasons.   Allergies: Ciprofloxacin, Fish allergy, and Penicillins  Current Medications:  Current Outpatient Medications:    albuterol (VENTOLIN HFA) 108 (90 Base) MCG/ACT inhaler, Inhale into the lungs every 6 (six) hours as needed for wheezing or shortness of  breath., Disp: , Rfl:    fluticasone (FLONASE) 50 MCG/ACT nasal spray, Place 1 spray into both nostrils at bedtime., Disp: , Rfl:    meloxicam (MOBIC) 15 MG tablet, TAKE 1 TABLET (15 MG TOTAL) BY MOUTH DAILY., Disp: 30 tablet, Rfl: 3   omeprazole (PRILOSEC) 10 MG capsule, Take 10 mg by mouth at bedtime. Sinus issues/heart burn, Disp: , Rfl:    pseudoephedrine (SUDAFED) 30 MG tablet, Take 30 mg by mouth every 4 (four) hours as needed for congestion., Disp: , Rfl:    rosuvastatin (CRESTOR) 10 MG tablet, Take 10 mg by mouth daily., Disp: , Rfl:    atomoxetine (STRATTERA) 40 MG capsule, Take 1 capsule (40 mg total) by mouth daily., Disp: 90 capsule, Rfl: 0   loratadine (CLARITIN) 10 MG tablet, Take 10 mg by mouth daily as needed for allergies. (Patient not taking: Reported on 02/03/2024), Disp: , Rfl:  Medication Side Effects: none  Family Medical/ Social History: Changes? No  MENTAL HEALTH EXAM:  There were no vitals taken for this visit.There is no height or weight on file to calculate BMI.  General Appearance: Casual and Well Groomed  Eye Contact:  Good  Speech:  Clear and Coherent and Normal Rate  Volume:  Normal  Mood:   sad  Affect:  Congruent  Thought Process:  Goal Directed and Descriptions of Associations: Circumstantial  Orientation:  Full (Time, Place, and Person)  Thought Content: Logical   Suicidal Thoughts:  No  Homicidal Thoughts:  No  Memory:  WNL  Judgement:  Good  Insight:  Good  Psychomotor Activity:  Normal  Concentration:  Concentration: Good and Attention Span: Fair  Recall:  Good  Fund of Knowledge: Good  Language: Good  Assets:  Communication Skills Desire for Improvement Financial Resources/Insurance Housing Transportation  ADL's:  Intact  Cognition: WNL  Prognosis:  Good   DIAGNOSES:  No diagnosis found.  Receiving Psychotherapy: No   RECOMMENDATIONS:   PDMP reviewed.  Codeine cough meds given 01/25/2024. I provided 20 minutes of face to face time  during this encounter, including time spent before and after the visit in records review, medical decision making, counseling pertinent to today's visit, and charting.   I am sorry to hear about her grandson and hope he recovers quickly.  We agree not to make a change in the Strattera dose right now.  Once she gets back to [place]  and if she feels the need to increase the dose, she can call and let me know.  I will send in 60 mg.  She understands.  Continue Strattera 40 mg daily. Starts therapy with Dell Fennel, [facility] next week. Return in 3 months.  Marvia Slocumb, PA-C

## 2024-02-04 ENCOUNTER — Ambulatory Visit: Payer: Medicare Other | Admitting: Physician Assistant

## 2024-02-05 DIAGNOSIS — G4733 Obstructive sleep apnea (adult) (pediatric): Secondary | ICD-10-CM | POA: Diagnosis not present

## 2024-02-05 DIAGNOSIS — G4700 Insomnia, unspecified: Secondary | ICD-10-CM | POA: Diagnosis not present

## 2024-02-09 DIAGNOSIS — J019 Acute sinusitis, unspecified: Secondary | ICD-10-CM | POA: Diagnosis not present

## 2024-03-09 ENCOUNTER — Other Ambulatory Visit: Payer: Self-pay | Admitting: Podiatry

## 2024-03-11 ENCOUNTER — Ambulatory Visit: Admitting: Professional Counselor

## 2024-03-12 ENCOUNTER — Ambulatory Visit: Admitting: Physician Assistant

## 2024-03-25 ENCOUNTER — Ambulatory Visit: Admitting: Professional Counselor

## 2024-04-07 ENCOUNTER — Ambulatory Visit: Admitting: Professional Counselor

## 2024-04-07 ENCOUNTER — Encounter: Payer: Self-pay | Admitting: Professional Counselor

## 2024-04-07 DIAGNOSIS — F330 Major depressive disorder, recurrent, mild: Secondary | ICD-10-CM

## 2024-04-07 DIAGNOSIS — F411 Generalized anxiety disorder: Secondary | ICD-10-CM

## 2024-04-07 NOTE — Progress Notes (Signed)
      [facility] Counselor/Therapist Progress Note  Patient ID: Charlene Hunt, MRN: [id number],    Date: 04/07/2024  Time Spent: 1:06 PM - 2:13 PM  Treatment Type: Individual Therapy  Reported Symptoms: worries, low mood, self esteem concerns, nervousness, restlessness, preoccupying thoughts, interpersonal concerns, caregiver strain, grief/loss  Mental Status Exam:  Appearance:   Neat     Behavior:  Appropriate, Sharing, and Motivated  Motor:  Normal  Speech/Language:   Clear and Coherent and Normal Rate  Affect:  Appropriate and Congruent  Mood:  normal  Thought process:  normal  Thought content:    WNL  Sensory/Perceptual disturbances:    WNL  Orientation:  oriented to person, place, time/date, and situation  Attention:  Good  Concentration:  Good  Memory:  WNL  Fund of knowledge:   Good  Insight:    Good  Judgment:   Good  Impulse Control:  Good   Risk Assessment: Danger to Self:  No Self-injurious Behavior: No Danger to Others: No Duty to Warn:no Physical Aggression / Violence:No  Access to Firearms a concern: No  Gang Involvement:No   Subjective: Patient presented to session to address concerns of anxiety and depression.  She reported mixed progress at this time.  She identified to continue to be struggling with symptoms however to no longer feel any vague SI.  She voiced feeling that her symptoms are an internal struggle that family circumstances intensify, and she processed her experience of trauma history with counselor.  Patient also reflected on recent trip to visit her daughter out of state and interpersonal challenges and familial concerns thereby.  Patient processed experience of grief and loss by history.  Counselor actively listened and affirmed patient feelings and experience, and helped to facilitate insight and resource strengths and coping skills.  Counselor and patient discussed patient treatment plan and patient gave her consent.    Interventions: Solution-Oriented/Positive Psychology, Humanistic/Existential, Insight-Oriented, and Treatment Planning, CBT  Diagnosis:   ICD-10-CM   1. Generalized anxiety disorder  F41.1     2. Major depressive disorder, recurrent episode, mild (HCC)  F33.0       Plan: Pt is scheduled for a follow-up; continue process work and developing coping skills. STG between sessions to work on increasing positive self talk and practicing reframe of self blaming narratives.   Charlene Hunt, [facility]

## 2024-04-19 ENCOUNTER — Ambulatory Visit: Admitting: Professional Counselor

## 2024-04-19 ENCOUNTER — Encounter: Payer: Self-pay | Admitting: Professional Counselor

## 2024-04-19 DIAGNOSIS — F411 Generalized anxiety disorder: Secondary | ICD-10-CM | POA: Diagnosis not present

## 2024-04-19 DIAGNOSIS — F330 Major depressive disorder, recurrent, mild: Secondary | ICD-10-CM
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
## 2024-04-29 ENCOUNTER — Other Ambulatory Visit: Payer: Self-pay | Admitting: Physician Assistant

## 2024-05-04 ENCOUNTER — Ambulatory Visit: Admitting: Professional Counselor

## 2024-05-04 ENCOUNTER — Encounter: Payer: Self-pay | Admitting: Professional Counselor

## 2024-05-04 DIAGNOSIS — F330 Major depressive disorder, recurrent, mild: Secondary | ICD-10-CM

## 2024-05-04 DIAGNOSIS — F411 Generalized anxiety disorder: Secondary | ICD-10-CM

## 2024-05-04 NOTE — Progress Notes (Signed)
      [facility] Counselor/Therapist Progress Note  Patient ID: Charlene Hunt, MRN: [id number],    Date: 05/04/2024  Time Spent: 11:12 AM - 12:15 PM   Treatment Type: Individual Therapy  Reported Symptoms: anxiousness, worries, restlessness, sleep concerns, trouble concentrating, intermittent low mood, family concerns   Mental Status Exam:  Appearance:   Neat     Behavior:  Appropriate, Sharing, and Motivated  Motor:  Normal  Speech/Language:   Clear and Coherent and Normal Rate  Affect:  Appropriate and Congruent  Mood:  normal  Thought process:  normal  Thought content:    WNL  Sensory/Perceptual disturbances:    WNL  Orientation:  oriented to person, place, time/date, and situation  Attention:  Good  Concentration:  Good  Memory:  WNL  Fund of knowledge:   Good  Insight:    Good  Judgment:   Good  Impulse Control:  Good   Risk Assessment: Danger to Self:  No Self-injurious Behavior: No Danger to Others: No Duty to Warn:no Physical Aggression / Violence:No  Access to Firearms a concern: No  Gang Involvement:No   Subjective: Patient presented to session to address concerns of anxiety and depression.  She reported mixed progress at this time.  Patient processed experience of concern for her granddaughter, and counselor assisted with psychoeducation regarding distress tolerance and emotional dysregulation, and helped with strategies for patient support for her granddaughter and family while also maintaining patient self-care and boundaries.  Patient processed experience of past and present relationships and voiced helpfulness of sharing her experiences and concerns.  Counselor actively listened and affirmed patient feelings and experience, and assisted patient with forgiveness work as relates her ex-husband.  Interventions: Solution-Oriented/Positive Psychology, Humanistic/Existential, and Insight-Oriented  Diagnosis:   ICD-10-CM   1. Generalized anxiety disorder   F41.1     2. Major depressive disorder, recurrent episode, mild (HCC)  F33.0       Plan: Pt is scheduled for a follow-up; continue process work and developing coping skills. STG between sessions to consider resources provided for assistance with family matter of concern; continue to reflect on sense of blocks in her awareness regarding trauma by hx and life concerns.   Charlene Hunt, [facility]

## 2024-05-05 ENCOUNTER — Encounter: Payer: Self-pay | Admitting: Physician Assistant

## 2024-05-05 ENCOUNTER — Ambulatory Visit: Admitting: Physician Assistant

## 2024-05-05 DIAGNOSIS — F900 Attention-deficit hyperactivity disorder, predominantly inattentive type: Secondary | ICD-10-CM | POA: Diagnosis not present

## 2024-05-05 DIAGNOSIS — F4323 Adjustment disorder with mixed anxiety and depressed mood: Secondary | ICD-10-CM

## 2024-05-05 MED ORDER — ATOMOXETINE HCL 60 MG PO CAPS: 60.0000 mg | ORAL_CAPSULE | Freq: Every day | ORAL | 0 refills | Status: DC

## 2024-05-05 NOTE — Progress Notes (Signed)
 [facility] Med Check  Patient ID: Charlene Hunt,  MRN: [id number]  PCP: Chrystal Lamarr RAMAN, MD  Date of Evaluation: 05/05/2024 Time spent:20 minutes  Chief Complaint:  Chief Complaint   Follow-up    HISTORY/CURRENT STATUS: HPI For routine med check  Under a lot of stress, one of her granddaughters is on autism spectrum, w/ behavioral problems. Her grandson in [place]  is doing ok after his surgery. States she's handling things as best she can.   At the [place]OV, we discussed increasing the Strattera  but decided not to since she wouldn't be in town. She'd like to increase now if possible. Not able to focus well and gets distracted easily. Also still has mild sx of depression too. She still able to do things she wants and needs to but sometimes takes more effort than it should.  Energy and motivation are good.  No extreme sadness, tearfulness, or feelings of hopelessness.  Sleeps well most of the time. ADLs and personal hygiene are normal.  Appetite has not changed.  Weight is stable.  Is anxious sometimes but she is able to control worry.   No tachycardia, palpitations, SOB, sweating, or trembling.  No mania, delirium, AH/VH. No SI/HI.  Denies dizziness, syncope, seizures, numbness, tingling, tremor, tics, unsteady gait, slurred speech, confusion. Denies muscle or joint pain, stiffness, or dystonia.  Individual Medical History/ Review of Systems: Changes? :No   Past medications for mental health diagnoses include: Cymbalta helped, Zoloft caused hives on her neck, Prozac made her 'crazy.' Wellbutrin made her tired. Adderall.   Had IOP years ago. No hospitalization for mental health reasons.   Allergies: Ciprofloxacin, Fish allergy, and Penicillins  Current Medications:  Current Outpatient Medications:    albuterol (VENTOLIN HFA) 108 (90 Base) MCG/ACT inhaler, Inhale into the lungs every 6 (six) hours as needed for wheezing or shortness of breath., Disp: , Rfl:    atomoxetine   (STRATTERA ) 60 MG capsule, Take 1 capsule (60 mg total) by mouth daily., Disp: 90 capsule, Rfl: 0   fluticasone (FLONASE) 50 MCG/ACT nasal spray, Place 1 spray into both nostrils at bedtime., Disp: , Rfl:    omeprazole (PRILOSEC) 10 MG capsule, Take 10 mg by mouth at bedtime. Sinus issues/heart burn, Disp: , Rfl:    pseudoephedrine (SUDAFED) 30 MG tablet, Take 30 mg by mouth every 4 (four) hours as needed for congestion., Disp: , Rfl:    rosuvastatin (CRESTOR) 10 MG tablet, Take 10 mg by mouth daily., Disp: , Rfl:    loratadine (CLARITIN) 10 MG tablet, Take 10 mg by mouth daily as needed for allergies. (Patient not taking: Reported on 02/03/2024), Disp: , Rfl:    meloxicam  (MOBIC ) 15 MG tablet, TAKE 1 TABLET (15 MG TOTAL) BY MOUTH DAILY. (Patient not taking: Reported on 05/05/2024), Disp: 30 tablet, Rfl: 3 Medication Side Effects: none  Family Medical/ Social History: Changes? No  MENTAL HEALTH EXAM:  There were no vitals taken for this visit.There is no height or weight on file to calculate BMI.  General Appearance: Casual and Well Groomed  Eye Contact:  Good  Speech:  Clear and Coherent and Normal Rate  Volume:  Normal  Mood:  Euthymic  Affect:  Congruent  Thought Process:  Goal Directed and Descriptions of Associations: Circumstantial  Orientation:  Full (Time, Place, and Person)  Thought Content: Logical   Suicidal Thoughts:  No  Homicidal Thoughts:  No  Memory:  WNL  Judgement:  Good  Insight:  Good  Psychomotor Activity:  Normal  Concentration:  Concentration: Good and Attention Span: Fair  Recall:  Good  Fund of Knowledge: Good  Language: Good  Assets:  Communication Skills Desire for Improvement Financial Resources/Insurance Housing Transportation  ADL's:  Intact  Cognition: WNL  Prognosis:  Good   DIAGNOSES:    ICD-10-CM   1. Attention deficit hyperactivity disorder (ADHD), predominantly inattentive type  F90.0     2. Situational mixed anxiety and depressive  disorder  F43.23      Receiving Psychotherapy: No   RECOMMENDATIONS:   PDMP reviewed.  Codeine cough meds given 01/25/2024. I provided approximately 20 minutes of face to face time during this encounter, including time spent before and after the visit in records review, medical decision making, counseling pertinent to today's visit, and charting.   Increase Strattera  to 60 mg daily. Continue therapy with Cato Sprang, [facility]. Return in 6-8 weeks.  Verneita Cooks, PA-C

## 2024-05-17 DIAGNOSIS — G4700 Insomnia, unspecified: Secondary | ICD-10-CM | POA: Diagnosis not present

## 2024-05-17 DIAGNOSIS — R0681 Apnea, not elsewhere classified: Secondary | ICD-10-CM | POA: Diagnosis not present

## 2024-05-18 ENCOUNTER — Encounter: Payer: Self-pay | Admitting: Professional Counselor

## 2024-05-18 ENCOUNTER — Ambulatory Visit: Admitting: Professional Counselor

## 2024-05-18 DIAGNOSIS — F411 Generalized anxiety disorder: Secondary | ICD-10-CM | POA: Diagnosis not present

## 2024-05-18 DIAGNOSIS — F3341 Major depressive disorder, recurrent, in partial remission: Secondary | ICD-10-CM

## 2024-05-18 NOTE — Progress Notes (Signed)
      [facility] Counselor/Therapist Progress Note  Patient ID: Charlene Hunt, MRN: [id number],    Date: 05/18/2024  Time Spent: 11:07 AM to 12:07 PM  Treatment Type: Individual Therapy  Reported Symptoms: worries, nervousness, stress, interpersonal concerns, phase of life concerns  Mental Status Exam:  Appearance:   Neat     Behavior:  Appropriate, Sharing, and Motivated  Motor:  Normal  Speech/Language:   Clear and Coherent and Normal Rate  Affect:  Appropriate and Congruent  Mood:  normal  Thought process:  normal  Thought content:    WNL  Sensory/Perceptual disturbances:    WNL  Orientation:  oriented to person, place, time/date, and situation  Attention:  Good  Concentration:  Good  Memory:  WNL  Fund of knowledge:   Good  Insight:    Good  Judgment:   Good  Impulse Control:  Good   Risk Assessment: Danger to Self:  No Self-injurious Behavior: No Danger to Others: No Duty to Warn:no Physical Aggression / Violence:No  Access to Firearms a concern: No  Gang Involvement:No   Subjective: Patient presented to session to address concerns of anxiety depression.  She reported mixed progress at this time.  She identified increase in psychotropic medication dose per prescribing provider and resulting efficacy.  She processed stress and worry around one of her granddaughters and her mental health concerns.  She processed experience of their strong bond, and her sense of influence with her granddaughter.  Counselor and patient discussed patient concerns, and counselor reinforced patient modeling of loving leadership, boundaries, and healthy and positive approach to life and relationships.  Patient shared regarding upcoming plans for trips to visit her son and desire to plan a fun trip for herself as well, and counselor and patient discussed emotional safety regarding visits with family.   Interventions: Solution-Oriented/Positive Psychology, Humanistic/Existential, and  Insight-Oriented  Diagnosis:   ICD-10-CM   1. Generalized anxiety disorder  F41.1     2. Major depressive disorder, recurrent episode, in partial remission (HCC)  F33.41       Plan: Patient is scheduled for follow-up; continue process work and developing coping skills.  STG between sessions for patient to continue to reinforce boundaries in interpersonal relationships, particularly in modeling circumstances; consider planning vacation for self.  [name], [facility]

## 2024-05-27 DIAGNOSIS — E7800 Pure hypercholesterolemia, unspecified: Secondary | ICD-10-CM | POA: Diagnosis not present

## 2024-05-27 DIAGNOSIS — E039 Hypothyroidism, unspecified: Secondary | ICD-10-CM | POA: Diagnosis not present

## 2024-06-11 ENCOUNTER — Ambulatory Visit: Admitting: Professional Counselor

## 2024-06-11 ENCOUNTER — Encounter: Payer: Self-pay | Admitting: Professional Counselor

## 2024-06-11 DIAGNOSIS — F411 Generalized anxiety disorder: Secondary | ICD-10-CM

## 2024-06-11 DIAGNOSIS — F3341 Major depressive disorder, recurrent, in partial remission: Secondary | ICD-10-CM | POA: Diagnosis not present

## 2024-06-11 NOTE — Progress Notes (Signed)
      [facility] Counselor/Therapist Progress Note  Patient ID: Charlene Hunt, MRN: [id number],    Date: 06/11/2024  Time Spent: 1:07 PM to 2:08 PM  Treatment Type: Individual Therapy  Reported Symptoms: Worries, anxiousness, trouble relaxing, restlessness, frustration, low mood, trauma response pattern including distress and physiological reactivity upon trauma cues, interpersonal concerns, phase of life concerns  Mental Status Exam:  Appearance:   Neat     Behavior:  Appropriate, Sharing, and Motivated  Motor:  Normal  Speech/Language:   Clear and Coherent and Normal Rate  Affect:  Tearful  Mood:  sad  Thought process:  normal  Thought content:    WNL  Sensory/Perceptual disturbances:    WNL  Orientation:  oriented to person, place, time/date, and situation  Attention:  Good  Concentration:  Good  Memory:  WNL  Fund of knowledge:   Good  Insight:    Good  Judgment:   Good  Impulse Control:  Good   Risk Assessment: Danger to Self:  No Self-injurious Behavior: No Danger to Others: No Duty to Warn:no Physical Aggression / Violence:No  Access to Firearms a concern: No  Gang Involvement:No   Subjective: Patient presented to session to address concerns of anxiety and depression.  She reported mixed progress at this time.  Patient processed experience of recent trip to visit one of her adult children and their family, and processed the experience of challenging events that included conflict and revisiting of old dynamics and behaviors that are hard for patient to cope with and to resolve with others.  She processed the experience of these ongoing dynamics and behaviors and impact on her wellbeing.  She identified having practiced boundaries, and counselor actively listened, affirmed patient feelings and experience including proactive efforts to protect herself and her wellbeing, helped facilitate insight and discussed strategies moving forward.  Counselor and patient  discussed aspects to events that may be related to this family members symptoms including history of TBI, trauma, and attachment needs, and issues of holistic safety.  Interventions: Solution-Oriented/Positive Psychology, Humanistic/Existential, and Insight-Oriented  Diagnosis:   ICD-10-CM   1. Generalized anxiety disorder  F41.1     2. Major depressive disorder, recurrent episode, in partial remission (HCC)  F33.41       Plan: Patient is scheduled for follow-up; continue process work and developing coping skills.  STG between sessions for patient to continue to prioritize her self-care and wellbeing and feel sure in her sense of what is best in protecting her time and energies as opposed to others expectations and/or demands.  Charlene Hunt, [facility]

## 2024-06-16 DIAGNOSIS — L308 Other specified dermatitis: Secondary | ICD-10-CM | POA: Diagnosis not present

## 2024-06-17 ENCOUNTER — Ambulatory Visit: Admitting: Physician Assistant

## 2024-06-23 DIAGNOSIS — G4733 Obstructive sleep apnea (adult) (pediatric): Secondary | ICD-10-CM | POA: Diagnosis not present

## 2024-06-25 ENCOUNTER — Encounter: Payer: Self-pay | Admitting: Professional Counselor

## 2024-06-25 ENCOUNTER — Ambulatory Visit: Admitting: Professional Counselor

## 2024-06-25 DIAGNOSIS — F411 Generalized anxiety disorder: Secondary | ICD-10-CM

## 2024-06-25 DIAGNOSIS — F3341 Major depressive disorder, recurrent, in partial remission: Secondary | ICD-10-CM | POA: Diagnosis not present
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
## 2024-06-27 DIAGNOSIS — E7800 Pure hypercholesterolemia, unspecified: Secondary | ICD-10-CM | POA: Diagnosis not present

## 2024-06-27 DIAGNOSIS — E039 Hypothyroidism, unspecified: Secondary | ICD-10-CM | POA: Diagnosis not present

## 2024-07-08 ENCOUNTER — Encounter: Payer: Self-pay | Admitting: Professional Counselor

## 2024-07-08 ENCOUNTER — Ambulatory Visit: Admitting: Professional Counselor

## 2024-07-08 DIAGNOSIS — F411 Generalized anxiety disorder: Secondary | ICD-10-CM | POA: Diagnosis not present

## 2024-07-08 DIAGNOSIS — F3341 Major depressive disorder, recurrent, in partial remission: Secondary | ICD-10-CM

## 2024-07-08 DIAGNOSIS — G4733 Obstructive sleep apnea (adult) (pediatric): Secondary | ICD-10-CM | POA: Diagnosis not present

## 2024-07-08 DIAGNOSIS — G4721 Circadian rhythm sleep disorder, delayed sleep phase type: Secondary | ICD-10-CM | POA: Diagnosis not present

## 2024-07-08 DIAGNOSIS — G4761 Periodic limb movement disorder: Secondary | ICD-10-CM | POA: Diagnosis not present
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
## 2024-07-14 DIAGNOSIS — G4761 Periodic limb movement disorder: Secondary | ICD-10-CM | POA: Diagnosis not present

## 2024-07-14 DIAGNOSIS — R946 Abnormal results of thyroid function studies: Secondary | ICD-10-CM | POA: Diagnosis not present

## 2024-07-14 DIAGNOSIS — Z23 Encounter for immunization: Secondary | ICD-10-CM | POA: Diagnosis not present

## 2024-07-14 DIAGNOSIS — I10 Essential (primary) hypertension: Secondary | ICD-10-CM | POA: Diagnosis not present

## 2024-07-14 DIAGNOSIS — E7800 Pure hypercholesterolemia, unspecified: Secondary | ICD-10-CM | POA: Diagnosis not present

## 2024-07-14 DIAGNOSIS — Z0000 Encounter for general adult medical examination without abnormal findings: Secondary | ICD-10-CM | POA: Diagnosis not present

## 2024-07-14 DIAGNOSIS — E559 Vitamin D deficiency, unspecified: Secondary | ICD-10-CM | POA: Diagnosis not present

## 2024-07-14 DIAGNOSIS — Z79899 Other long term (current) drug therapy: Secondary | ICD-10-CM | POA: Diagnosis not present

## 2024-07-14 DIAGNOSIS — E039 Hypothyroidism, unspecified: Secondary | ICD-10-CM | POA: Diagnosis not present

## 2024-07-14 DIAGNOSIS — F17290 Nicotine dependence, other tobacco product, uncomplicated: Secondary | ICD-10-CM | POA: Diagnosis not present

## 2024-07-14 DIAGNOSIS — Z1211 Encounter for screening for malignant neoplasm of colon: Secondary | ICD-10-CM | POA: Diagnosis not present

## 2024-07-21 ENCOUNTER — Ambulatory Visit: Admitting: Physician Assistant

## 2024-07-21 ENCOUNTER — Encounter: Payer: Self-pay | Admitting: Physician Assistant

## 2024-07-21 DIAGNOSIS — F4323 Adjustment disorder with mixed anxiety and depressed mood: Secondary | ICD-10-CM | POA: Diagnosis not present

## 2024-07-21 DIAGNOSIS — F900 Attention-deficit hyperactivity disorder, predominantly inattentive type: Secondary | ICD-10-CM | POA: Diagnosis not present

## 2024-07-21 MED ORDER — ATOMOXETINE HCL 60 MG PO CAPS: 60.0000 mg | ORAL_CAPSULE | Freq: Every day | ORAL | 0 refills | Status: DC

## 2024-07-21 NOTE — Progress Notes (Signed)
 [facility] Med Check  Patient ID: Charlene Hunt,  MRN: [id number]  PCP: Chrystal Lamarr RAMAN, MD  Date of Evaluation: 07/21/2024 Time spent:20 minutes  Chief Complaint:  Chief Complaint   ADHD; Follow-up    HISTORY/CURRENT STATUS: HPI For routine med check  Strattera  was increased at the LOV.  Feels like it's working well.  States that attention is good without easy distractibility.  Able to focus on things and finish tasks to completion.   Under a lot of stress with family issues, not worse, it's just difficult, mostly with her granddaughter. Ronal Caldron isn't having PA but it's all overwhelming.  Patient is able to enjoy things.  Energy and motivation are good most of the time.  No extreme sadness, tearfulness, or feelings of hopelessness.  Sleeps ok.  ADLs and personal hygiene are normal.   No change in memory.  Appetite has not changed.  Weight is stable.  No mania, delirium, AH/VH.  No SI/HI.  Individual Medical History/ Review of Systems: Changes? :Yes  will start CPAP soon.  Past medications for mental health diagnoses include: Cymbalta helped, Zoloft caused hives on her neck, Prozac made her 'crazy.' Wellbutrin made her tired. Adderall.   Had IOP years ago. No hospitalization for mental health reasons.   Allergies: Ciprofloxacin, Fish allergy, and Penicillins  Current Medications:  Current Outpatient Medications:    albuterol (VENTOLIN HFA) 108 (90 Base) MCG/ACT inhaler, Inhale into the lungs every 6 (six) hours as needed for wheezing or shortness of breath., Disp: , Rfl:    fluticasone (FLONASE) 50 MCG/ACT nasal spray, Place 1 spray into both nostrils at bedtime., Disp: , Rfl:    omeprazole (PRILOSEC) 10 MG capsule, Take 10 mg by mouth at bedtime. Sinus issues/heart burn, Disp: , Rfl:    pseudoephedrine (SUDAFED) 30 MG tablet, Take 30 mg by mouth every 4 (four) hours as needed for congestion., Disp: , Rfl:    rosuvastatin (CRESTOR) 10 MG tablet, Take 10 mg by  mouth daily., Disp: , Rfl:    atomoxetine  (STRATTERA ) 60 MG capsule, Take 1 capsule (60 mg total) by mouth daily., Disp: 90 capsule, Rfl: 0   loratadine (CLARITIN) 10 MG tablet, Take 10 mg by mouth daily as needed for allergies. (Patient not taking: Reported on 07/21/2024), Disp: , Rfl:    meloxicam  (MOBIC ) 15 MG tablet, TAKE 1 TABLET (15 MG TOTAL) BY MOUTH DAILY. (Patient not taking: Reported on 05/05/2024), Disp: 30 tablet, Rfl: 3 Medication Side Effects: none  Family Medical/ Social History: Changes? No  MENTAL HEALTH EXAM:  There were no vitals taken for this visit.There is no height or weight on file to calculate BMI.  General Appearance: Casual and Well Groomed  Eye Contact:  Good  Speech:  Clear and Coherent and Normal Rate  Volume:  Normal  Mood:  Euthymic  Affect:  Congruent  Thought Process:  Goal Directed and Descriptions of Associations: Circumstantial  Orientation:  Full (Time, Place, and Person)  Thought Content: Logical   Suicidal Thoughts:  No  Homicidal Thoughts:  No  Memory:  WNL  Judgement:  Good  Insight:  Good  Psychomotor Activity:  Normal  Concentration:  Concentration: Good and Attention Span: Good  Recall:  Good  Fund of Knowledge: Good  Language: Good  Assets:  Communication Skills Desire for Improvement Financial Resources/Insurance Housing Transportation  ADL's:  Intact  Cognition: WNL  Prognosis:  Good   DIAGNOSES:    ICD-10-CM   1. Attention deficit hyperactivity disorder (ADHD), predominantly inattentive  type  F90.0     2. Situational mixed anxiety and depressive disorder  F43.23      Receiving Psychotherapy: No   RECOMMENDATIONS:   PDMP reviewed.  Codeine cough meds given 01/25/2024. I provided approximately 20  minutes of face to face time during this encounter, including time spent before and after the visit in records review, medical decision making, counseling pertinent to today's visit, and charting.   She's stable on the current  dose of Strattera  so no changes will be made.   Continue Strattera   60 mg daily. Continue therapy with Cato Sprang, [facility]. Return in 3 months.  Verneita Cooks, PA-C

## 2024-07-22 ENCOUNTER — Encounter: Payer: Self-pay | Admitting: Professional Counselor

## 2024-07-22 ENCOUNTER — Ambulatory Visit: Admitting: Professional Counselor

## 2024-07-22 DIAGNOSIS — F3341 Major depressive disorder, recurrent, in partial remission: Secondary | ICD-10-CM

## 2024-07-22 DIAGNOSIS — F411 Generalized anxiety disorder: Secondary | ICD-10-CM | POA: Diagnosis not present
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
## 2024-07-23 DIAGNOSIS — H524 Presbyopia: Secondary | ICD-10-CM | POA: Diagnosis not present

## 2024-07-23 DIAGNOSIS — Z961 Presence of intraocular lens: Secondary | ICD-10-CM | POA: Diagnosis not present

## 2024-07-27 DIAGNOSIS — E039 Hypothyroidism, unspecified: Secondary | ICD-10-CM | POA: Diagnosis not present

## 2024-07-27 DIAGNOSIS — E7800 Pure hypercholesterolemia, unspecified: Secondary | ICD-10-CM | POA: Diagnosis not present

## 2024-08-02 ENCOUNTER — Ambulatory Visit: Admitting: Professional Counselor

## 2024-08-02 ENCOUNTER — Encounter: Payer: Self-pay | Admitting: Professional Counselor

## 2024-08-02 DIAGNOSIS — F3341 Major depressive disorder, recurrent, in partial remission: Secondary | ICD-10-CM

## 2024-08-02 DIAGNOSIS — F411 Generalized anxiety disorder: Secondary | ICD-10-CM | POA: Diagnosis not present

## 2024-08-02 NOTE — Progress Notes (Signed)
      [facility] Counselor/Therapist Progress Note  Patient ID: Charlene Hunt, MRN: [id number],    Date: 08/19/2024  Time Spent: 1:07 PM to 2:08 PM  Treatment Type: Individual Therapy  Reported Symptoms: Worries, distress, health concerns, adaptation concerns, phase of life concerns, anxiousness  Mental Status Exam:  Appearance:   Neat     Behavior:  Appropriate, Sharing, and Motivated  Motor:  Normal  Speech/Language:   Clear and Coherent and Normal Rate  Affect:  Appropriate and Congruent  Mood:  normal  Thought process:  normal  Thought content:    WNL  Sensory/Perceptual disturbances:    WNL  Orientation:  oriented to person, place, time/date, and situation  Attention:  Good  Concentration:  Good  Memory:  WNL  Fund of knowledge:   Good  Insight:    Good  Judgment:   Good  Impulse Control:  Good   Risk Assessment: Danger to Self:  No Self-injurious Behavior: No Danger to Others: No Duty to Warn:no Physical Aggression / Violence:No  Access to Firearms a concern: No  Gang Involvement:No   Subjective: Patient presented to session to address concerns of anxiety and depression in remission.  She reported makes progress at this time.  She reported challenges in adjusting to her new CPAP, and counselor and patient discussed assorted strategies for improvement, and points of discussion/concern regarding experience of machine to follow-up with per medical provider.  Patient identified desire for employment, and counselor and patient discussed idea of seasonal work; patient processed experience of her work history, including related interests and challenges.  Counselor actively listened and reinforced patient sense of discernment including as relates her value and skill set.  Patient processed experience of concern for granddaughter experiencing difficulty at this time; counselor helped to instill hope that while she may not yet seem to be improving, that by virtue of  getting help, the foundation is being lain.  Counselor and patient discussed patient trusting in God's plan per her faith orientation.  Interventions: Solution-Oriented/Positive Psychology, Humanistic/Existential, and Insight-Oriented, Spiritually-Integrated Psychotherapy  Diagnosis:   ICD-10-CM   1. Generalized anxiety disorder  F41.1     2. Major depressive disorder, recurrent episode, in partial remission  F33.41       Plan: Patient is scheduled for follow-up; continue process work and developing coping skills.  Patient short-term goals between sessions to continue to work with CPAP and to follow-up with medical provider; consider looking into seasonal work; resource hope and spirituality as coping mechanisms.  [name], [facility]

## 2024-08-19 ENCOUNTER — Ambulatory Visit: Admitting: Professional Counselor

## 2024-08-19 ENCOUNTER — Encounter: Payer: Self-pay | Admitting: Professional Counselor

## 2024-08-19 DIAGNOSIS — F4323 Adjustment disorder with mixed anxiety and depressed mood: Secondary | ICD-10-CM

## 2024-08-19 NOTE — Progress Notes (Signed)
      [facility] Counselor/Therapist Progress Note  Patient ID: Charlene Hunt, MRN: [id number],    Date: 08/19/2024  Time Spent: 11:05 AM to 12:02 PM  Treatment Type: Individual Therapy  Reported Symptoms: Worries, stress, interpersonal concerns, anxiousness, restlessness, sleep concerns, fatigue, trouble concentrating, phase of life concerns  Mental Status Exam:  Appearance:   Neat     Behavior:  Appropriate and Sharing  Motor:  Normal  Speech/Language:   Clear and Coherent and Normal Rate  Affect:  Appropriate and Congruent  Mood:  normal  Thought process:  normal  Thought content:    WNL  Sensory/Perceptual disturbances:    WNL  Orientation:  oriented to person, place, time/date, and situation  Attention:  Good  Concentration:  Good  Memory:  WNL  Fund of knowledge:   Good  Insight:    Good  Judgment:   Good  Impulse Control:  Good   Risk Assessment: Danger to Self:  No Self-injurious Behavior: No Danger to Others: No Duty to Warn:no Physical Aggression / Violence:No  Access to Firearms a concern: No  Gang Involvement:No   Subjective: Patient presented to session to address concerns of anxiety and depression.  She reported mixed progress at this time.  She processed experience of exacerbated worry regarding her granddaughter and her son, and processed discernment around holiday plans given assorted family dynamics and needs.  Counselor actively listened, affirmed patient feelings and experience, and helped facilitate insight into patient concerns.  Counselor provided psychoeducation around genograms, and encouraged patient to consider reading 1 between sessions to reflect on family system trauma, relationships and dynamics.  Patient processed her upbringing particularly relationship with her own mother, and reflected on cultural patterns in family of origin.  She reflected on her parenting experience, and her perspective on her children's parenting  styles.  Interventions: Solution-Oriented/Positive Psychology, Humanistic/Existential, and Insight-Oriented  Diagnosis:   ICD-10-CM   1. Situational mixed anxiety and depressive disorder  F43.23       Plan: Patient is scheduled for follow-up; continue process work and developing coping skills.  Short-term goal between sessions for patient to consider creating genogram as discussed in session, with resources provided by counselor.  [name], [facility]

## 2024-09-02 ENCOUNTER — Ambulatory Visit: Admitting: Professional Counselor

## 2024-09-16 ENCOUNTER — Ambulatory Visit: Admitting: Professional Counselor

## 2024-09-30 ENCOUNTER — Telehealth: Payer: Self-pay | Admitting: Physician Assistant

## 2024-09-30 ENCOUNTER — Other Ambulatory Visit: Payer: Self-pay

## 2024-09-30 ENCOUNTER — Ambulatory Visit (INDEPENDENT_AMBULATORY_CARE_PROVIDER_SITE_OTHER): Admitting: Professional Counselor

## 2024-09-30 ENCOUNTER — Encounter: Payer: Self-pay | Admitting: Professional Counselor

## 2024-09-30 DIAGNOSIS — F900 Attention-deficit hyperactivity disorder, predominantly inattentive type: Secondary | ICD-10-CM

## 2024-09-30 DIAGNOSIS — F331 Major depressive disorder, recurrent, moderate: Secondary | ICD-10-CM

## 2024-09-30 DIAGNOSIS — F411 Generalized anxiety disorder: Secondary | ICD-10-CM | POA: Diagnosis not present

## 2024-09-30 MED ORDER — ATOMOXETINE HCL 80 MG PO CAPS: 80.0000 mg | ORAL_CAPSULE | Freq: Every day | ORAL | 1 refills | Status: DC

## 2024-09-30 NOTE — Telephone Encounter (Signed)
 She was here to see [facility] today, and mentioned anhedonia, sleep probs, decreased self care, fatigue, trouble concentrating.  Her meds aren't working as well as they did.  I recommend increasing the Strattera  to 80 mg daily.  Please let her know and send in an Rx for # 30 with 1 RF. Thanks.

## 2024-09-30 NOTE — Telephone Encounter (Signed)
 Rx for Strattera  80 mg sent to [facility] in [employer] on [address] and notation made on Rx to cancel 60 mg. Patient notified of this. She said she thought she had a RF already at the pharmacy for 60 mg and asked if she could refuse it. I told her I put notation on the 80 mg script to cancel 60 mg, but to be sure and check dose before she walks away from the counter to be sure she gets the correct dose.

## 2024-09-30 NOTE — Telephone Encounter (Signed)
 New Rx for Strattera  80 mg sent to [facility] [employer], with notation to cancel 60 mg.

## 2024-09-30 NOTE — Progress Notes (Signed)
"   °      [facility] Counselor/Therapist Progress Note  Patient ID: Charlene Hunt, MRN: [id number],    Date: 09/30/2024  Time Spent: 1:04 PM to 2:02 PM  Treatment Type: Individual Therapy  Reported Symptoms: anhedonia, low mood, sleeplessness, fatigue, erratic eating patterns, self esteem concerns, trouble concentrating, anxiousness, worries,irritability, catastrophic thinking, low motivation, interpersonal concerns, stress, phase of life concerns  Mental Status Exam:  Appearance:   Neat     Behavior:  Appropriate and Sharing  Motor:  Normal  Speech/Language:   Clear and Coherent and Normal Rate  Affect:  Appropriate and Congruent  Mood:  dysthymic  Thought process:  normal  Thought content:    WNL  Sensory/Perceptual disturbances:    WNL  Orientation:  oriented to person, place, time/date, and situation  Attention:  Good  Concentration:  Good  Memory:  WNL  Fund of knowledge:   Good  Insight:    Good  Judgment:   Good  Impulse Control:  Good   Risk Assessment: Danger to Self:  No Self-injurious Behavior: No Danger to Others: No Duty to Warn:no Physical Aggression / Violence:No  Access to Firearms a concern: No  Gang Involvement:No   Subjective: Patient presented to session to address concerns of anxiety and depression.  She reported mixed progress at this time.  She processed experience of frustration regarding her CPAP, concerns regarding dynamics with her son, and worries regarding her granddaughter's wellbeing.  She voiced concern that she may need a medication adjustment.  Counselor encouraged follow-up with prescribing provider.  Counselor facilitated PHQ-9 and patient scored a 20, and GAD-7 and patient scored a 13.  Counselor and patient discussed results.  Counselor assisted patient in discernment and strategies around her interpersonal concerns, identifying healthy coping skills and resourcing motivation. Patient identified intention to increase home and  self-care, prioritize tai chi and her seniors group.  Interventions: Solution-Oriented/Positive Psychology, Humanistic/Existential, and Insight-Oriented, Assessments  Diagnosis:   ICD-10-CM   1. Major depressive disorder, recurrent episode, moderate (HCC)  F33.1     2. Generalized anxiety disorder  F41.1       Plan: Patient is scheduled for follow-up; continue process work and developing coping skills.  Patient short-term goal to follow-up with prescribing provider regarding medication concerns and symptomology, work to prioritize home and self-care, including tai chi and seniors group.  [name], [facility]                   "

## 2024-10-14 ENCOUNTER — Ambulatory Visit: Admitting: Physician Assistant

## 2024-10-15 ENCOUNTER — Ambulatory Visit: Admitting: Physician Assistant

## 2024-10-15 ENCOUNTER — Ambulatory Visit: Admitting: Professional Counselor

## 2024-11-03 ENCOUNTER — Encounter: Payer: Self-pay | Admitting: Professional Counselor

## 2024-11-03 ENCOUNTER — Ambulatory Visit: Admitting: Professional Counselor

## 2024-11-03 DIAGNOSIS — F411 Generalized anxiety disorder: Secondary | ICD-10-CM

## 2024-11-03 DIAGNOSIS — F330 Major depressive disorder, recurrent, mild: Secondary | ICD-10-CM | POA: Diagnosis not present
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
## 2024-11-11 ENCOUNTER — Ambulatory Visit: Admitting: Physician Assistant

## 2024-11-11 ENCOUNTER — Encounter: Payer: Self-pay | Admitting: Physician Assistant

## 2024-11-11 DIAGNOSIS — F900 Attention-deficit hyperactivity disorder, predominantly inattentive type: Secondary | ICD-10-CM | POA: Diagnosis not present

## 2024-11-11 DIAGNOSIS — F411 Generalized anxiety disorder: Secondary | ICD-10-CM

## 2024-11-11 DIAGNOSIS — F3341 Major depressive disorder, recurrent, in partial remission: Secondary | ICD-10-CM | POA: Diagnosis not present

## 2024-11-11 MED ORDER — ATOMOXETINE HCL 80 MG PO CAPS: 80.0000 mg | ORAL_CAPSULE | Freq: Every day | ORAL | 1 refills | Status: AC

## 2024-11-11 NOTE — Progress Notes (Signed)
 "     [facility] Med Check  Patient ID: Charlene Hunt,  MRN: [id number]  PCP: Chrystal Lamarr RAMAN, MD  Date of Evaluation: 11/11/2024 Time spent:20 minutes  Chief Complaint:  Chief Complaint   Follow-up    HISTORY/CURRENT STATUS: HPI For routine med check  We increased the Strattera  6 weeks ago.  It's been really helpful. It started working pretty much immediately.  States that attention is good without easy distractibility.  Able to focus on things and finish tasks to completion.   Patient is able to enjoy things.  Energy and motivation are good.   No extreme sadok.   ADLs and personal hygiene are normal.    Appetite has not changed.  Weight is stable.   No mania, delirium, AH/VH.  No SI/HI.  Individual Medical History/ Review of Systems: Changes? :No    Past medications for mental health diagnoses include: Cymbalta helped, Zoloft caused hives on her neck, Prozac made her 'crazy.' Wellbutrin made her tired. Adderall.   Had IOP years ago. No hospitalization for mental health reasons.   Allergies: Ciprofloxacin, Fish allergy, and Penicillins  Current Medications:  Current Outpatient Medications:    albuterol (VENTOLIN HFA) 108 (90 Base) MCG/ACT inhaler, Inhale into the lungs every 6 (six) hours as needed for wheezing or shortness of breath., Disp: , Rfl:    fluticasone (FLONASE) 50 MCG/ACT nasal spray, Place 1 spray into both nostrils at bedtime., Disp: , Rfl:    omeprazole (PRILOSEC) 10 MG capsule, Take 10 mg by mouth at bedtime. Sinus issues/heart burn, Disp: , Rfl:    rosuvastatin (CRESTOR) 10 MG tablet, Take 10 mg by mouth daily., Disp: , Rfl:    atomoxetine  (STRATTERA ) 80 MG capsule, Take 1 capsule (80 mg total) by mouth daily., Disp: 90 capsule, Rfl: 1   loratadine (CLARITIN) 10 MG tablet, Take 10 mg by mouth daily as needed for allergies. (Patient not taking: Reported on 07/21/2024), Disp: , Rfl:    meloxicam  (MOBIC ) 15 MG tablet, TAKE 1 TABLET (15 MG TOTAL) BY  MOUTH DAILY. (Patient not taking: Reported on 05/05/2024), Disp: 30 tablet, Rfl: 3   pseudoephedrine (SUDAFED) 30 MG tablet, Take 30 mg by mouth every 4 (four) hours as needed for congestion., Disp: , Rfl:  Medication Side Effects: none  Family Medical/ Social History: Changes? No  MENTAL HEALTH EXAM:  There were no vitals taken for this visit.There is no height or weight on file to calculate BMI.  General Appearance: Casual and Well Groomed  Eye Contact:  Good  Speech:  Clear and Coherent and Normal Rate  Volume:  Normal  Mood:  Euthymic  Affect:  Congruent  Thought Process:  Goal Directed and Descriptions of Associations: Circumstantial  Orientation:  Full (Time, Place, and Person)  Thought Content: Logical   Suicidal Thoughts:  No  Homicidal Thoughts:  No  Memory:  WNL  Judgement:  Good  Insight:  Good  Psychomotor Activity:  Normal  Concentration:  Concentration: Good and Attention Span: Good  Recall:  Good  Fund of Knowledge: Good  Language: Good  Assets:  Communication Skills Desire for Improvement Financial Resources/Insurance Housing Resilience Transportation  ADL's:  Intact  Cognition: WNL  Prognosis:  Good   DIAGNOSES:    ICD-10-CM   1. Attention deficit hyperactivity disorder (ADHD), predominantly inattentive type  F90.0 atomoxetine  (STRATTERA ) 80 MG capsule    2. Generalized anxiety disorder  F41.1     3. 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  Codeine cough meds given 01/25/2024. I provided approximately 20  minutes of face to face time during this encounter, including time spent before and after the visit in records review, medical decision making, counseling pertinent to today's visit, and charting.   She's doing well on the current dose of Strattera .  No changes needed.   Continue Strattera   80 mg daily. Continue therapy with Cato Sprang,  [facility]. Return in 3 months.  Verneita Cooks, PA-C  "

## 2024-11-15 ENCOUNTER — Encounter: Payer: Self-pay | Admitting: Professional Counselor

## 2024-11-15 ENCOUNTER — Ambulatory Visit: Admitting: Professional Counselor

## 2024-11-15 DIAGNOSIS — F330 Major depressive disorder, recurrent, mild: Secondary | ICD-10-CM

## 2024-11-15 DIAGNOSIS — F411 Generalized anxiety disorder: Secondary | ICD-10-CM
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
## 2024-12-01 ENCOUNTER — Encounter: Payer: Self-pay | Admitting: Professional Counselor

## 2024-12-01 ENCOUNTER — Ambulatory Visit: Admitting: Professional Counselor

## 2024-12-01 DIAGNOSIS — F411 Generalized anxiety disorder: Secondary | ICD-10-CM

## 2024-12-01 DIAGNOSIS — F330 Major depressive disorder, recurrent, mild: Secondary | ICD-10-CM
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
## 2024-12-15 ENCOUNTER — Ambulatory Visit: Admitting: Professional Counselor

## 2025-01-12 ENCOUNTER — Ambulatory Visit: Admitting: Professional Counselor

## 2025-01-26 ENCOUNTER — Ambulatory Visit: Admitting: Professional Counselor

## 2025-02-03 ENCOUNTER — Ambulatory Visit: Admitting: Professional Counselor

## 2025-02-08 ENCOUNTER — Ambulatory Visit: Admitting: Professional Counselor

## 2025-02-09 ENCOUNTER — Ambulatory Visit: Admitting: Physician Assistant

## 2025-02-16 ENCOUNTER — Ambulatory Visit: Admitting: Professional Counselor

## 2025-02-22 ENCOUNTER — Ambulatory Visit: Admitting: Professional Counselor
